# Patient Record
Sex: Male | Born: 1985 | Race: White | Hispanic: No | Marital: Married | Smoking: Never smoker
Health system: Southern US, Community
[De-identification: ages and names within clinical notes are randomized; demographics above are authoritative.]

## PROBLEM LIST (undated history)

## (undated) DIAGNOSIS — F909 Attention-deficit hyperactivity disorder, unspecified type: Secondary | ICD-10-CM

## (undated) DIAGNOSIS — R55 Syncope and collapse: Secondary | ICD-10-CM

## (undated) DIAGNOSIS — N400 Enlarged prostate without lower urinary tract symptoms: Secondary | ICD-10-CM

## (undated) HISTORY — DX: Syncope and collapse: R55

## (undated) HISTORY — DX: Attention-deficit hyperactivity disorder, unspecified type: F90.9

## (undated) HISTORY — PX: NO PAST SURGERIES: SHX2092

## (undated) HISTORY — DX: Benign prostatic hyperplasia without lower urinary tract symptoms: N40.0

---

## 2011-04-20 DIAGNOSIS — F419 Anxiety disorder, unspecified: Secondary | ICD-10-CM | POA: Insufficient documentation

## 2011-04-20 DIAGNOSIS — M791 Myalgia, unspecified site: Secondary | ICD-10-CM | POA: Insufficient documentation

## 2011-07-08 DIAGNOSIS — M545 Low back pain, unspecified: Secondary | ICD-10-CM | POA: Insufficient documentation

## 2013-03-17 ENCOUNTER — Ambulatory Visit: Payer: Self-pay | Admitting: Family Medicine

## 2016-01-21 ENCOUNTER — Emergency Department: Payer: Self-pay

## 2016-01-21 ENCOUNTER — Encounter: Payer: Self-pay | Admitting: Emergency Medicine

## 2016-01-21 ENCOUNTER — Emergency Department
Admission: EM | Admit: 2016-01-21 | Discharge: 2016-01-21 | Disposition: A | Discharge status: Home / Self Care | Payer: Self-pay | Attending: Emergency Medicine | Admitting: Emergency Medicine

## 2016-01-21 DIAGNOSIS — W228XXA Striking against or struck by other objects, initial encounter: Secondary | ICD-10-CM | POA: Insufficient documentation

## 2016-01-21 DIAGNOSIS — Y999 Unspecified external cause status: Secondary | ICD-10-CM | POA: Insufficient documentation

## 2016-01-21 DIAGNOSIS — S92535A Nondisplaced fracture of distal phalanx of left lesser toe(s), initial encounter for closed fracture: Secondary | ICD-10-CM | POA: Insufficient documentation

## 2016-01-21 DIAGNOSIS — Y929 Unspecified place or not applicable: Secondary | ICD-10-CM | POA: Insufficient documentation

## 2016-01-21 DIAGNOSIS — Y939 Activity, unspecified: Secondary | ICD-10-CM | POA: Insufficient documentation

## 2016-01-21 DIAGNOSIS — S92402A Displaced unspecified fracture of left great toe, initial encounter for closed fracture: Secondary | ICD-10-CM

## 2016-01-21 MED ORDER — NAPROXEN 500 MG PO TABS: 500.0000 mg | ORAL_TABLET | Freq: Two times a day (BID) | ORAL | Status: DC

## 2016-01-21 MED ORDER — HYDROCODONE-ACETAMINOPHEN 5-325 MG PO TABS: 1.0000 | ORAL_TABLET | ORAL | Status: DC | PRN

## 2016-01-21 NOTE — Discharge Instructions (Signed)
Ice and elevate as needed for pain and swelling. Wear wooden shoe for support. Follow-up with Dr. Alberteen Spindleline at [facility]. You will need to call and make an appointment.

## 2016-01-21 NOTE — ED Notes (Signed)
Reports boards fell on foot yesterday, having left great toe pain.  Ambulates without difficulty

## 2016-01-21 NOTE — ED Provider Notes (Signed)
[facility] Emergency Department Provider Note   ____________________________________________  Time seen: Approximately 8:40 AM  I have reviewed the triage vital signs and the nursing notes.   HISTORY  Chief Complaint Toe Injury   HPI Eric Mccormick is a 30 y.o. male is here with complaint of left great toe pain after a board fell on his left foot yesterday. He states that pain has increased especially with walking. This morning he noticed some bruising and swelling. He is unable to walk normally secondary to his pain. Pain is minimal while sitting. The pain is possibly an 8 while walking.   History reviewed. No pertinent past medical history.  There are no active problems to display for this patient.   History reviewed. No pertinent past surgical history.  Current Outpatient Rx  Name  Route  Sig  Dispense  Refill  . HYDROcodone-acetaminophen (NORCO/VICODIN) 5-325 MG tablet   Oral   Take 1 tablet by mouth every 4 (four) hours as needed for moderate pain.   20 tablet   0   . naproxen (NAPROSYN) 500 MG tablet   Oral   Take 1 tablet (500 mg total) by mouth 2 (two) times daily with a meal.   30 tablet   0     Allergies Review of patient's allergies indicates no known allergies.  No family history on file.  Social History Social History  Substance Use Topics  . Smoking status: Never Smoker   . Smokeless tobacco: None  . Alcohol Use: None    Review of Systems Constitutional: No fever/chills Cardiovascular: Denies chest pain. Respiratory: Denies shortness of breath. Gastrointestinal:   No nausea, no vomiting.  Musculoskeletal: Left great toe pain Skin: Negative for rash. Neurological: Negative for headaches, focal weakness or numbness.  10-point ROS otherwise negative.  ____________________________________________   PHYSICAL EXAM:  VITAL SIGNS: ED Triage Vitals  Enc Vitals Group     BP 01/21/16 0826 118/85 mmHg     Pulse  Rate 01/21/16 0826 98     Resp 01/21/16 0826 18     Temp 01/21/16 0826 97.9 F (36.6 C)     Temp Source 01/21/16 0826 Oral     SpO2 01/21/16 0826 100 %     Weight 01/21/16 0826 160 lb (72.576 kg)     Height 01/21/16 0826  (1.651 m)     Head Cir --      Peak Flow --      Pain Score --      Pain Loc --      Pain Edu? --      Excl. in GC? --     Constitutional: Alert and oriented. Well appearing and in no acute distress. Eyes: Conjunctivae are normal. PERRL. EOMI. Head: Atraumatic. Nose: No congestion/rhinnorhea. Neck: No stridor.   Cardiovascular: Normal rate, regular rhythm. Grossly normal heart sounds.  Good peripheral circulation. Respiratory: Normal respiratory effort.  No retractions. Lungs CTAB. Musculoskeletal: Left great toe with moderate tenderness and some ecchymosis at the base of the nail. There is some soft tissue swelling and tenderness on palpation. Motor sensory function intact. Remainder of the foot is nontender to palpation. No edema is noted. Neurologic:  Normal speech and language. No gross focal neurologic deficits are appreciated. No gait instability. Skin:  Skin is warm, dry and intact. Ecchymosis of the nail. No abrasions or bleeding noted Psychiatric: Mood and affect are normal. Speech and behavior are normal.  ____________________________________________   LABS (all labs ordered  are listed, but only abnormal results are displayed)  Labs Reviewed - No data to display  RADIOLOGY  Left foot per radiologist is suspicious for nondisplaced fracture lateral aspect of the distal phalanx left great toe. I, Tommi Rumpshonda L Marvie Calender, personally viewed and evaluated these images (plain radiographs) as part of my medical decision making, as well as reviewing the written report by the radiologist. ____________________________________________   PROCEDURES  Procedure(s) performed: None  Critical Care performed:  No  ____________________________________________   INITIAL IMPRESSION / ASSESSMENT AND PLAN / ED COURSE  Pertinent labs & imaging results that were available during my care of the patient were reviewed by me and considered in my medical decision making (see chart for details).  Patient's toe was buddy taped to the second toe and a postop shoe was placed. Patient is to follow-up with Dr. Alberteen Spindleline. Patient is given a prescription for naproxen and Norco as needed for severe pain. ____________________________________________   FINAL CLINICAL IMPRESSION(S) / ED DIAGNOSES  Final diagnoses:  Fracture of great toe, left, closed, initial encounter      NEW MEDICATIONS STARTED DURING THIS VISIT:  Discharge Medication List as of 01/21/2016  9:41 AM    START taking these medications   Details  HYDROcodone-acetaminophen (NORCO/VICODIN) 5-325 MG tablet Take 1 tablet by mouth every 4 (four) hours as needed for moderate pain., Starting 01/21/2016, Until Discontinued, Print    naproxen (NAPROSYN) 500 MG tablet Take 1 tablet (500 mg total) by mouth 2 (two) times daily with a meal., Starting 01/21/2016, Until Discontinued, Print         Note:  This document was prepared using Dragon voice recognition software and may include unintentional dictation errors.    Tommi RumpsRhonda L Jawaun Celmer, PA-C 01/21/16 1116  Arnaldo NatalPaul F Malinda, MD 01/21/16 [phone]

## 2016-01-21 NOTE — ED Notes (Signed)
See triage note  States he had a board fall onto left foot yesterday  Having pain with some bruising noted to left great toe

## 2017-10-09 IMAGING — DX DG FOOT COMPLETE 3+V*L*
3 series · 3 of 3 positions shown · non-contrast
Comparison: None

CLINICAL DATA: Dropped Ardiasnan Cenil on LEFT foot at work today,
pain across top of foot, initial encounter

EXAM:
LEFT FOOT - COMPLETE 3+ VIEW

[foot ap]
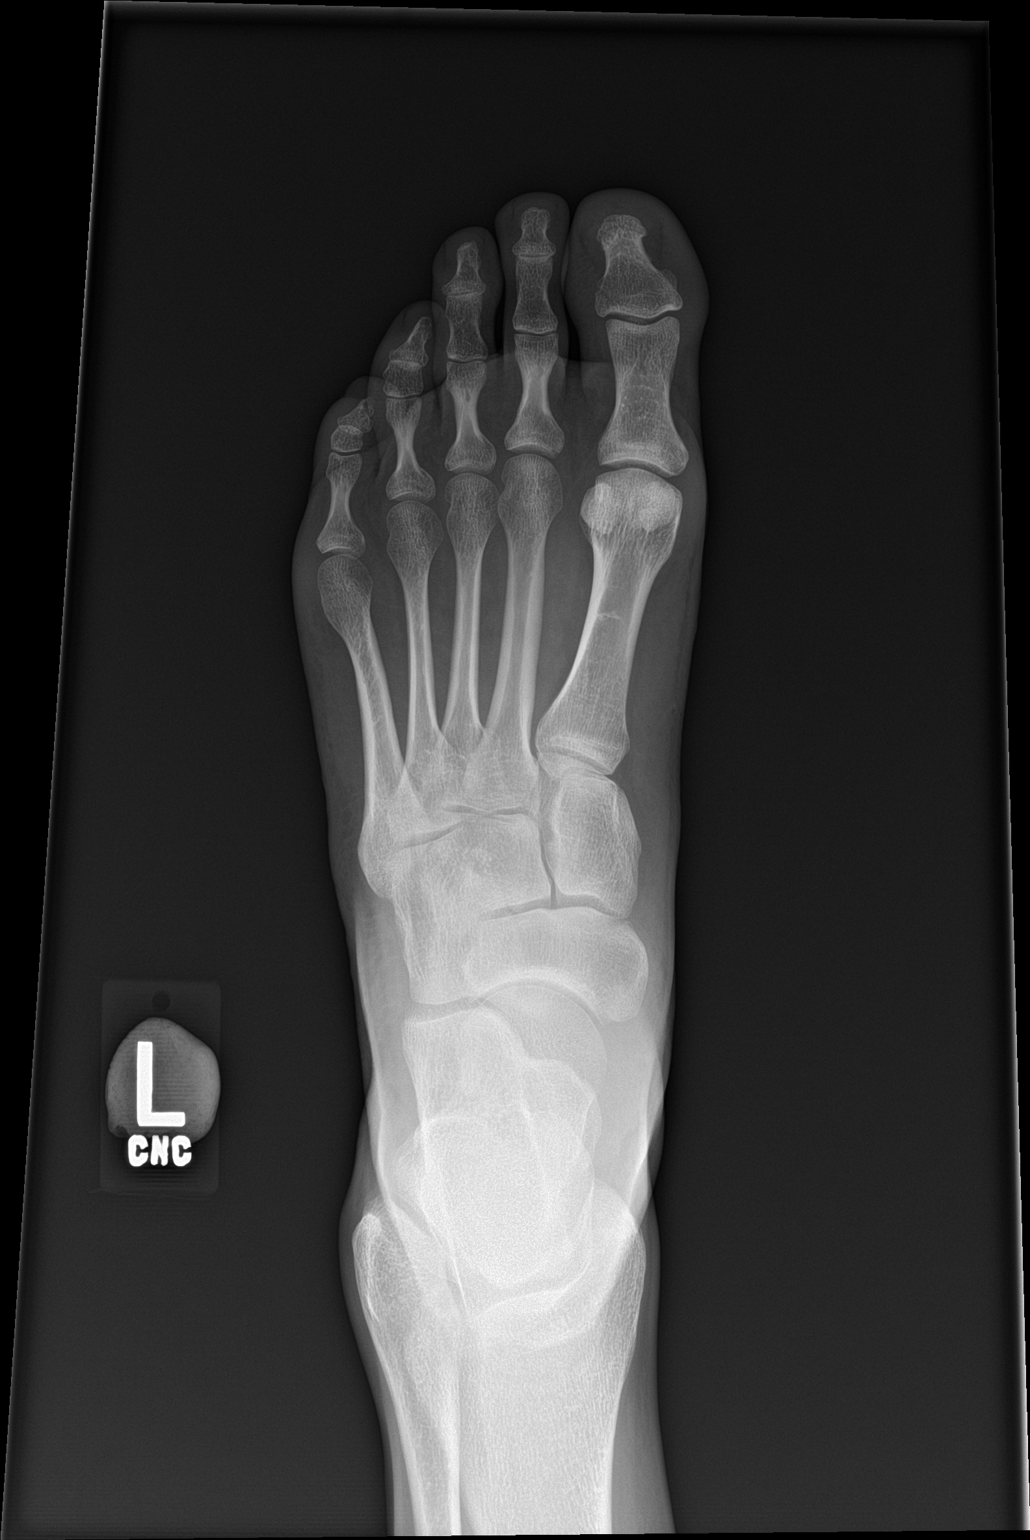

[foot obl]
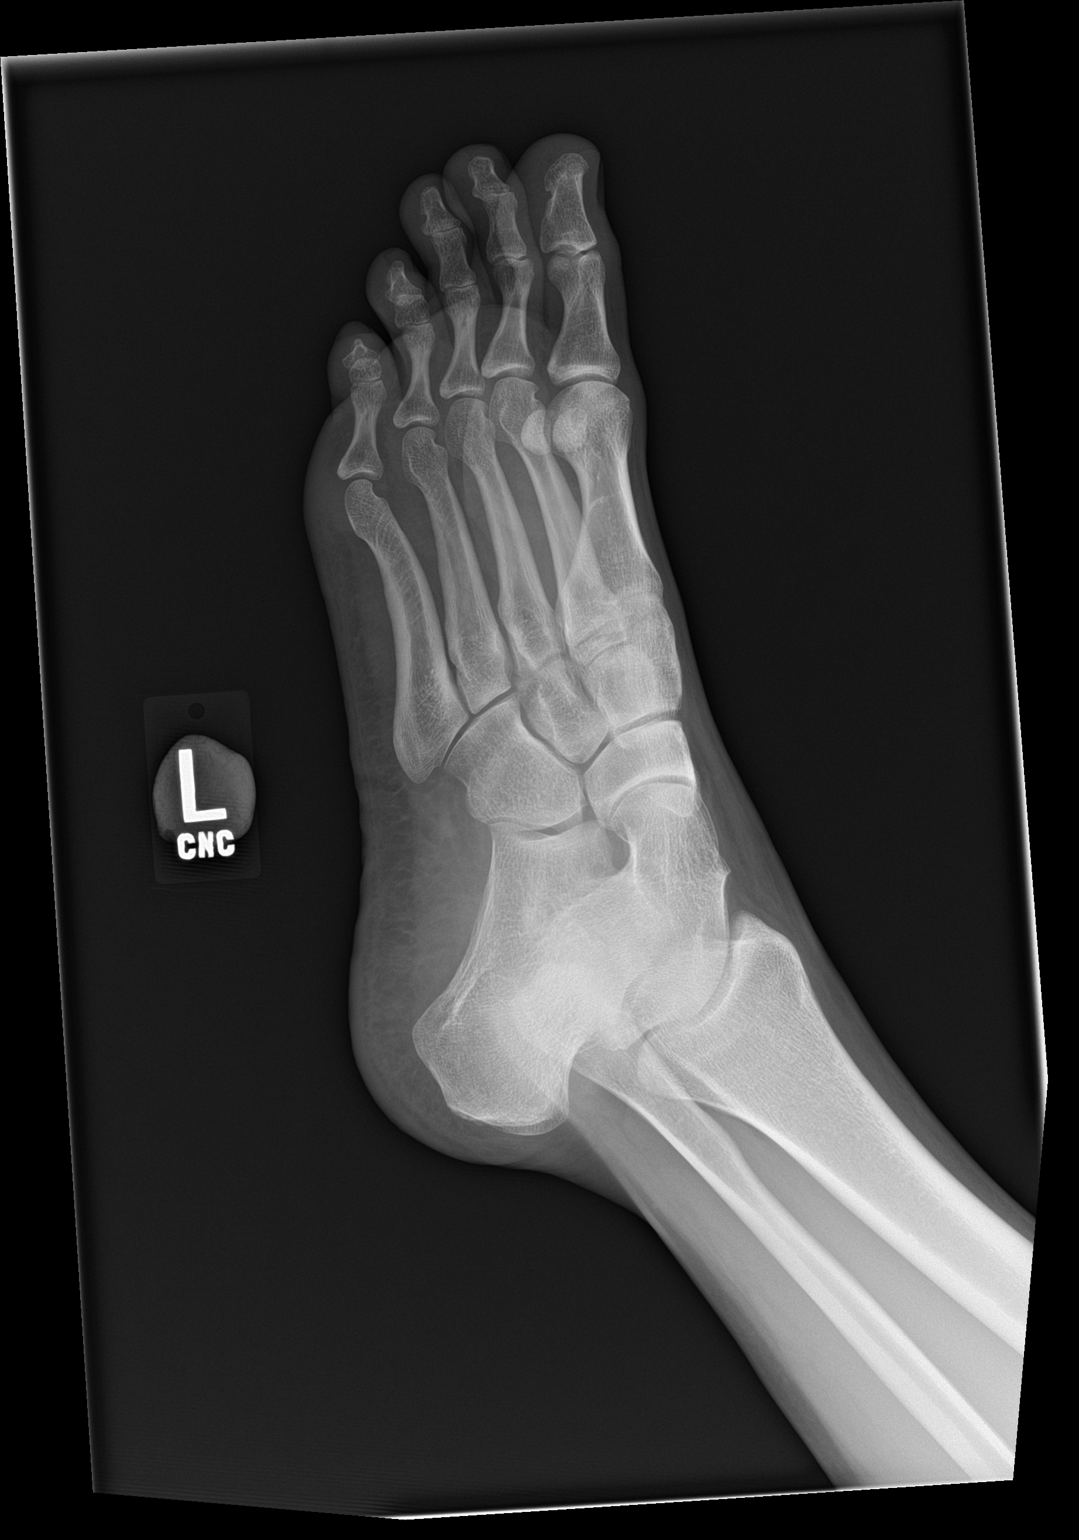

[foot lat]
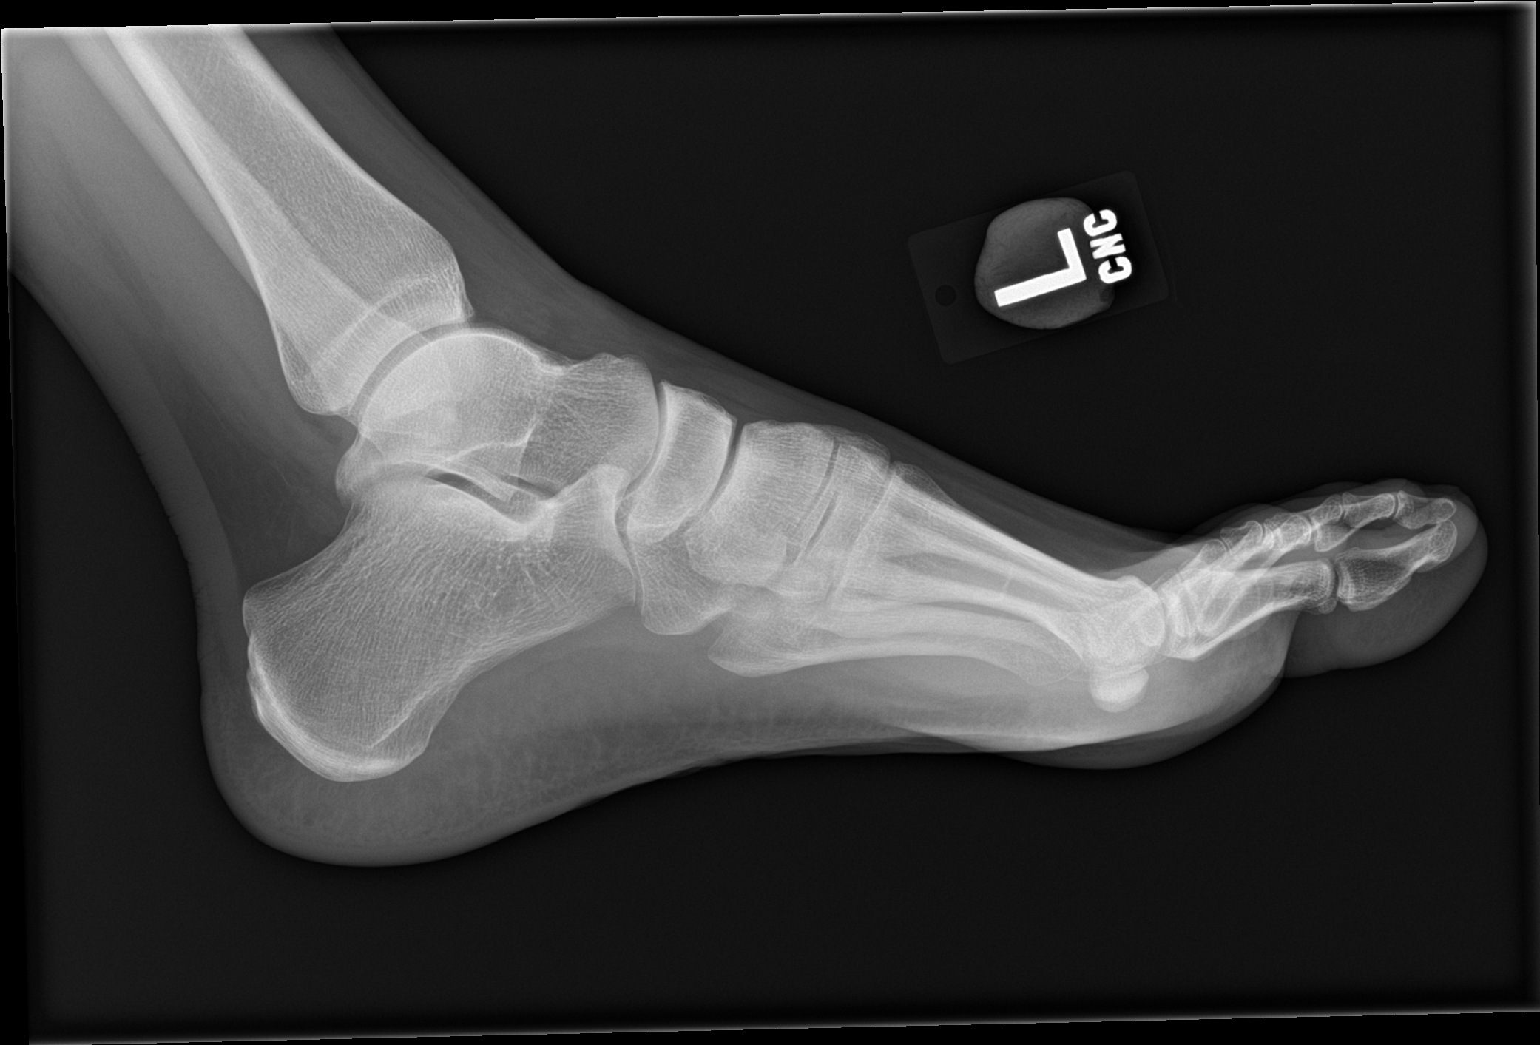

[3 of 3 positions shown; findings below may reference images not displayed]

FINDINGS: Osseous mineralization normal.

Joint spaces preserved.

Questionable linear lucency versus artifact at lateral aspect base
of distal phalanx great toe, cannot exclude nondisplaced fracture.

No additional fracture, dislocation or bone destruction.

Soft tissues unremarkable.
IMPRESSION: Questionable artifact versus nondisplaced fracture at lateral aspect
base of distal phalanx LEFT great toe; correlation for
pain/tenderness at this site recommended.

## 2018-01-09 DIAGNOSIS — D485 Neoplasm of uncertain behavior of skin: Secondary | ICD-10-CM | POA: Diagnosis not present

## 2018-01-09 DIAGNOSIS — D225 Melanocytic nevi of trunk: Secondary | ICD-10-CM | POA: Diagnosis not present

## 2018-01-09 DIAGNOSIS — Q825 Congenital non-neoplastic nevus: Secondary | ICD-10-CM | POA: Diagnosis not present

## 2018-01-09 DIAGNOSIS — L821 Other seborrheic keratosis: Secondary | ICD-10-CM | POA: Diagnosis not present

## 2018-01-09 DIAGNOSIS — D2262 Melanocytic nevi of left upper limb, including shoulder: Secondary | ICD-10-CM | POA: Diagnosis not present

## 2018-05-28 DIAGNOSIS — M545 Low back pain: Secondary | ICD-10-CM | POA: Diagnosis not present

## 2018-08-26 DIAGNOSIS — M545 Low back pain: Secondary | ICD-10-CM | POA: Diagnosis not present

## 2019-04-29 ENCOUNTER — Other Ambulatory Visit: Payer: Self-pay

## 2019-04-29 ENCOUNTER — Encounter: Payer: Self-pay | Admitting: Emergency Medicine

## 2019-04-29 ENCOUNTER — Ambulatory Visit
Admission: EM | Admit: 2019-04-29 | Discharge: 2019-04-29 | Disposition: A | Discharge status: Home / Self Care | Payer: BC Managed Care – PPO | Attending: Family Medicine | Admitting: Family Medicine

## 2019-04-29 DIAGNOSIS — M7711 Lateral epicondylitis, right elbow: Secondary | ICD-10-CM

## 2019-04-29 MED ORDER — MELOXICAM 15 MG PO TABS: 15.0000 mg | ORAL_TABLET | Freq: Every day | ORAL | 0 refills | Status: DC

## 2019-04-29 MED ORDER — HYDROCODONE-ACETAMINOPHEN 5-325 MG PO TABS: ORAL_TABLET | ORAL | 0 refills | Status: DC

## 2019-04-29 NOTE — ED Provider Notes (Signed)
[facility]    CSN: [id number] Arrival date & time: 04/29/19  1555      History   Chief Complaint Chief Complaint  Patient presents with  . Elbow Pain    right    HPI Houston SirenRobert B Monk is a 33 y.o. male.   33 yo male with a c/o right elbow pain for the past month. Denies any specific injury, falls, or other direct trauma. Denies rash, swelling, numbness/tingling.      History reviewed. No pertinent past medical history.  There are no active problems to display for this patient.   Past Surgical History:  Procedure Laterality Date  . NO PAST SURGERIES         Home Medications    Prior to Admission medications   Medication Sig Start Date End Date Taking? Authorizing Provider  HYDROcodone-acetaminophen (NORCO/VICODIN) 5-325 MG tablet 1-2 tabs po qd prn 04/29/19   Payton Mccallumonty, Tien Spooner, MD  meloxicam (MOBIC) 15 MG tablet Take 1 tablet (15 mg total) by mouth daily. 04/29/19   Payton Mccallumonty, Delissa Silba, MD  naproxen (NAPROSYN) 500 MG tablet Take 1 tablet (500 mg total) by mouth 2 (two) times daily with a meal. 01/21/16   Tommi RumpsSummers, Rhonda L, PA-C    Family History Family History  Problem Relation Age of Onset  . Hypertension Mother   . Hypertension Father     Social History Social History   Tobacco Use  . Smoking status: Never Smoker  . Smokeless tobacco: Never Used  Substance Use Topics  . Alcohol use: Not Currently  . Drug use: Not Currently     Allergies   Patient has no known allergies.   Review of Systems Review of Systems   Physical Exam Triage Vital Signs ED Triage Vitals  Enc Vitals Group     BP 04/29/19 1629 125/85     Pulse Rate 04/29/19 1629 (!) 115     Resp 04/29/19 1629 18     Temp 04/29/19 1629 99 F (37.2 C)     Temp Source 04/29/19 1629 Oral     SpO2 04/29/19 1629 98 %     Weight 04/29/19 1626 162 lb (73.5 kg)     Height 04/29/19 1626 5\' 6"  (1.676 m)     Head Circumference --      Peak Flow --      Pain Score 04/29/19 1626 6   Pain Loc --      Pain Edu? --      Excl. in GC? --    No data found.  Updated Vital Signs BP 125/85 (BP Location: Left Arm)   Pulse (!) 115   Temp 99 F (37.2 C) (Oral)   Resp 18   Ht 5\' 6"  (1.676 m)   Wt 73.5 kg   SpO2 98%   BMI 26.15 kg/m   Visual Acuity Right Eye Distance:   Left Eye Distance:   Bilateral Distance:    Right Eye Near:   Left Eye Near:    Bilateral Near:     Physical Exam Vitals signs and nursing note reviewed.  Constitutional:      General: He is not in acute distress.    Appearance: He is not toxic-appearing or diaphoretic.  Musculoskeletal:     Right elbow: He exhibits normal range of motion, no swelling, no effusion, no deformity and no laceration. Tenderness found. Lateral epicondyle tenderness noted.     Comments: Right arm neurovascularly intact  Neurological:     Mental Status: He is  alert.      UC Treatments / Results  Labs (all labs ordered are listed, but only abnormal results are displayed) Labs Reviewed - No data to display  EKG   Radiology No results found.  Procedures Procedures (including critical care time)  Medications Ordered in UC Medications - No data to display  Initial Impression / Assessment and Plan / UC Course  I have reviewed the triage vital signs and the nursing notes.  Pertinent labs & imaging results that were available during my care of the patient were reviewed by me and considered in my medical decision making (see chart for details).      Final Clinical Impressions(s) / UC Diagnoses   Final diagnoses:  Lateral epicondylitis of right elbow     Discharge Instructions     Rest, ice    ED Prescriptions    Medication Sig Dispense Auth. Provider   meloxicam (MOBIC) 15 MG tablet Take 1 tablet (15 mg total) by mouth daily. 30 tablet Norval Gable, MD   HYDROcodone-acetaminophen (NORCO/VICODIN) 5-325 MG tablet 1-2 tabs po qd prn 8 tablet Norval Gable, MD      1. diagnosis reviewed with  patient 2. rx as per orders above; reviewed possible side effects, interactions, risks and benefits  3. Recommend supportive treatment as above 4. Follow-up prn if symptoms worsen or don't improve   Controlled Substance Prescriptions [place] Controlled Substance Registry consulted? Yes, I have consulted the [place] Controlled Substances Registry for this patient, and feel the risk/benefit ratio today is favorable for proceeding with this prescription for a controlled substance.   Norval Gable, MD 04/29/19 [phone]

## 2019-04-29 NOTE — Discharge Instructions (Signed)
Rest, ice

## 2019-04-29 NOTE — ED Triage Notes (Signed)
Pt c/o right elbow pain. Started about a month ago. He rests it on the weekends but then when he goes back to work it gets worse again.

## 2019-07-08 DIAGNOSIS — M542 Cervicalgia: Secondary | ICD-10-CM | POA: Insufficient documentation

## 2019-07-08 DIAGNOSIS — S29012A Strain of muscle and tendon of back wall of thorax, initial encounter: Secondary | ICD-10-CM | POA: Diagnosis not present

## 2019-07-16 DIAGNOSIS — M5412 Radiculopathy, cervical region: Secondary | ICD-10-CM | POA: Diagnosis not present

## 2019-07-16 DIAGNOSIS — M542 Cervicalgia: Secondary | ICD-10-CM | POA: Diagnosis not present

## 2019-07-16 DIAGNOSIS — S29012A Strain of muscle and tendon of back wall of thorax, initial encounter: Secondary | ICD-10-CM | POA: Diagnosis not present

## 2019-07-29 NOTE — Progress Notes (Addendum)
Patient: Eric Mccormick, Male    DOB: 26-Jul-1986, 33 y.o.   MRN: [id number] Visit Date: 07/30/2019  Today's Provider: Jairo Ben, FNP   Chief Complaint  Patient presents with  . Establish Care  . New Patient (Initial Visit)   Subjective:     New Patient/Establish Care: Eric Mccormick is a 33 y.o. male who presents today to establish care as new patient. He feels well. He reports exercising none. He reports he is sleeping well.  ----------------------------------------------------------------- Patient with history of ADHD and is not medication at this time and feels that he need to go back on the medication he has not been taking it for a few years. He has taken Adderall 20 mg XR. Patient feels that his focused is not well and he gets sight track all the time.  He is had an injury and is on Hydrocodone -acetaminophen 5-325 mg followed by [facility] Dr. Margit Banda and is due to follow-up in 2 weeks.He finished the Prednisone.   Saw Dr. Craig Staggers in [place] as PCP last - he will get records sent.    He has a history of syncope - last 2 years ago. He denies any episodes in over two years- saw specialist- holter monitor was performed by cardiology.  He reports that all was " all ok". Denies ever seeing neurology in past. He denies any new or changing concerns. [facility] test [facility]  He denies any drug use. Alcohol occasionally.   He denies headaches or any neurological symptoms.  Reviewed: CT brain without contrast2/27/2015 [facility] Result Narrative  ** NONCONTRAST BRAIN CT **  INDICATION: 780.2 Syncope and collapse, cardiac provided.  COMPARISON: None  TECHNIQUE: Standard noncontrast brain CT.  FINDINGS: No hemorrhage, extra-axial fluid collection, cerebral edema, acute cortical infarction, mass, mass effect, midline shift. Moderate microangiopathic disease. Ventricles are normal for patient age. Mild sulcal prominence.  Minimal right maxillary sinus disease. Otherwise normal paranasal sinuses, orbits and cranium.  IMPRESSION: No acute intracranial abnormalities.  Electronically Reviewed FA:[id number] Cho, MD Electronically Reviewed on:10/25/2013 8:51 PM  I have reviewed the images and concur with the above findings.  Electronically Signed HQ:IONGEX Judeth Horn, MD Electronically Signed on:10/27/2013 7:46 PM     Patient  denies any fever, body aches,chills, rash, chest pain, shortness of breath, nausea, vomiting, or diarrhea.   Review of Systems  Constitutional: Negative for activity change, appetite change, chills, diaphoresis, fatigue, fever and unexpected weight change.  HENT: Positive for congestion ("chronic"), nosebleeds, postnasal drip, sinus pressure and tinnitus. Negative for dental problem, drooling, ear discharge, ear pain, facial swelling, hearing loss, mouth sores, rhinorrhea, sinus pain, sneezing, sore throat, trouble swallowing and voice change.        Chronic no change. Takes generic generic zyrtec and generic allergy nose spray.   Eyes: Negative for photophobia, pain, discharge, redness, itching and visual disturbance.  Respiratory: Negative for apnea, cough, choking, chest tightness, shortness of breath, wheezing and stridor.   Cardiovascular: Negative for chest pain, palpitations and leg swelling.  Gastrointestinal: Negative.   Endocrine: Positive for polydipsia and polyuria.  Genitourinary: Positive for urgency. Negative for decreased urine volume, difficulty urinating, discharge, dysuria, enuresis, flank pain, frequency, genital sores, hematuria, penile pain, penile swelling, scrotal swelling and testicular pain.       Frequent urination. Was told by last pcp - had enlarged prostate and took antibiotics. Has improved, he has urgency no frequency. Denies any burning.   No genital  sore, in past he has had. Denies any now.   Musculoskeletal: Positive for arthralgias, back pain  ("chronic") and myalgias.  Skin: Negative.   Allergic/Immunologic: Positive for food allergies ("mussels only").  Neurological: Positive for syncope ("with fainting spells" over two years ago none since denies any pre syncope either. ). Negative for dizziness, tremors, seizures, facial asymmetry, speech difficulty, weakness, light-headedness, numbness and headaches.  Hematological: Negative for adenopathy. Does not bruise/bleed easily.  Psychiatric/Behavioral: Positive for decreased concentration (he reports he has been evaluated and treated since a child. ). Negative for agitation, behavioral problems, confusion, dysphoric mood, hallucinations, self-injury, sleep disturbance and suicidal ideas. The patient is not nervous/anxious and is not hyperactive.        He has been on Adderal since he was 33 years old. No issues in past.    All other systems reviewed and are negative.   Social History      He  reports that he has never smoked. He has never used smokeless tobacco. He reports previous alcohol use. He reports previous drug use.       Social History   Socioeconomic History  . Marital status: Married    Spouse name: Not on file  . Number of children: Not on file  . Years of education: Not on file  . Highest education level: Not on file  Occupational History  . Not on file  Social Needs  . Financial resource strain: Not on file  . Food insecurity    Worry: Not on file    Inability: Not on file  . Transportation needs    Medical: Not on file    Non-medical: Not on file  Tobacco Use  . Smoking status: Never Smoker  . Smokeless tobacco: Never Used  Substance and Sexual Activity  . Alcohol use: Not Currently  . Drug use: Not Currently  . Sexual activity: Not on file  Lifestyle  . Physical activity    Days per week: Not on file    Minutes per session: Not on file  . Stress: Not on file  Relationships  . Social Musician on phone: Not on file    Gets together: Not  on file    Attends religious service: Not on file    Active member of club or organization: Not on file    Attends meetings of clubs or organizations: Not on file    Relationship status: Not on file  Other Topics Concern  . Not on file  Social History Narrative  . Not on file    Past Medical History:  Diagnosis Date  . ADHD   . Enlarged prostate   . Vasovagal syncope      Patient Active Problem List   Diagnosis Date Noted  . Cervical spine pain 07/08/2019  . Low back pain 07/08/2011  . ADD (attention deficit disorder) 04/20/2011  . Anxiety 04/20/2011  . Muscle pain 04/20/2011    Past Surgical History:  Procedure Laterality Date  . NO PAST SURGERIES      Family History        Family Status  Relation Name Status  . Mother  Alive  . Father  Alive  . Daughter  (Not Specified)  . Son  (Not Specified)  . Mat Uncle  (Not Specified)  . Emelda Brothers  (Not Specified)  . Oneal Grout  Other       substance abuse  . MGM  (Not Specified)  . MGF  (Not Specified)  .  PGM  (Not Specified)  . PGF  Other       fatal MI @ [age]        His family history includes ADD / ADHD in his daughter and son; Arthritis in his paternal grandmother; Cancer in his paternal aunt; Hyperlipidemia in his father and maternal grandmother; Hypertension in his father, maternal grandfather, maternal grandmother, maternal uncle, and mother; Macular degeneration in his paternal grandmother.      Allergies  Allergen Reactions  . Other Nausea And Vomiting    Mussels     Current Outpatient Medications:  .  amphetamine-dextroamphetamine (ADDERALL) 20 MG tablet, Take by mouth., Disp: , Rfl:  .  clonazePAM (KLONOPIN) 0.5 MG tablet, Take by mouth., Disp: , Rfl:  .  HYDROcodone-acetaminophen (NORCO/VICODIN) 5-325 MG tablet, 1-2 tabs po qd prn, Disp: 8 tablet, Rfl: 0 .  ibuprofen (ADVIL) 600 MG tablet, Take 600 mg by mouth as needed. , Disp: , Rfl:  .  meloxicam (MOBIC) 15 MG tablet, Take 1 tablet (15 mg total) by  mouth daily. (Patient not taking: Reported on 07/30/2019), Disp: 30 tablet, Rfl: 0 .  naproxen (NAPROSYN) 500 MG tablet, Take 1 tablet (500 mg total) by mouth 2 (two) times daily with a meal. (Patient not taking: Reported on 07/30/2019), Disp: 30 tablet, Rfl: 0 .  predniSONE (DELTASONE) 10 MG tablet, prednisone 10 mg tablet, Disp: , Rfl:  .  traMADol (ULTRAM) 50 MG tablet, Take 50 mg by mouth every 4 (four) hours., Disp: , Rfl:    Patient Care Team: Berniece PapFlinchum, Michelle S, FNP as PCP - General (Family Medicine)    Objective:      Vitals:   07/30/19 0913  BP: 132/88  Pulse: 72  Resp: 16  Temp: 98.1 F (36.7 C)  TempSrc: Temporal  SpO2: 97%  Weight: 179 lb (81.2 kg)  Height: 5\' 5"  (1.651 m)  Body mass index is 29.79 kg/m.  Physical Exam Vitals signs reviewed.  Constitutional:      General: He is not in acute distress.    Appearance: Normal appearance. He is not ill-appearing, toxic-appearing or diaphoretic.  HENT:     Head: Normocephalic and atraumatic.     Right Ear: Tympanic membrane, ear canal and external ear normal. There is no impacted cerumen.     Left Ear: Tympanic membrane, ear canal and external ear normal. There is no impacted cerumen.     Nose: Nose normal.     Mouth/Throat:     Mouth: Mucous membranes are moist.     Pharynx: No oropharyngeal exudate or posterior oropharyngeal erythema.  Eyes:     General: No scleral icterus.       Right eye: No discharge.        Left eye: No discharge.     Extraocular Movements: Extraocular movements intact.     Conjunctiva/sclera: Conjunctivae normal.     Pupils: Pupils are equal, round, and reactive to light.  Neck:     Musculoskeletal: Normal range of motion and neck supple.  Cardiovascular:     Rate and Rhythm: Normal rate and regular rhythm.  Pulmonary:     Effort: Pulmonary effort is normal. No respiratory distress.     Breath sounds: Normal breath sounds. No stridor. No wheezing, rhonchi or rales.  Chest:     Chest  wall: No tenderness.  Abdominal:     General: There is no distension.     Palpations: Abdomen is soft. There is no mass.     Tenderness:  There is no abdominal tenderness. There is no right CVA tenderness, left CVA tenderness, guarding or rebound.     Hernia: No hernia is present.  Genitourinary:    Comments: Declined by patient. / deferred.  Musculoskeletal: Normal range of motion.  Skin:    General: Skin is warm.     Capillary Refill: Capillary refill takes less than 2 seconds.  Neurological:     Mental Status: He is alert and oriented to person, place, and time.  Psychiatric:        Mood and Affect: Mood normal.        Behavior: Behavior normal.        Thought Content: Thought content normal.        Judgment: Judgment normal.      Depression Screen PHQ 2/9 Scores 07/30/2019  PHQ - 2 Score 0       Assessment & Plan:     Routine Health Maintenance and Physical Exam  Exercise Activities and Dietary recommendations Goals   None     Immunization History  Administered Date(s) Administered  . Influenza-Unspecified 06/09/2011    Health Maintenance  Topic Date Due  . HIV Screening  02/03/2001  . TETANUS/TDAP  02/03/2005  . INFLUENZA VACCINE  03/30/2019     Discussed health benefits of physical activity, and encouraged him to engage in regular exercise appropriate for his age and condition.     1. Need for influenza vaccination  - Flu Vaccine QUAD 36+ mos PF IM (Fluarix & Fluzone Quad PF)  2. History of acute prostatitis He still feels he has urinary frequency and pressure at times.   - Ambulatory referral to Urology  3. Long-term current use of stimulant  - Ambulatory referral to Cardiology  4. History of syncope Has seen cardiology over 5 years ago. Would like to establish with cardiology in the area.  - Ambulatory referral to Cardiology  5. Routine health maintenance  Drug Screen 12+Alcohol+CRT, Ur - CBC with Differential/Platelet 005009 - CMP  322000 - PSA - TSH - Lipid panel - Urinalysis, Routine w reflex microscopic  Neck pain, he will keep follow up's with his specialist at [facility] - no narcotic refills given when requested. Advised over the counter pain medications as on package or fill with specialty.   6. Attention deficit hyperactivity disorder (ADHD), unspecified ADHD type He has been off medication for a few years last prescribed 2015. Would like him evaluated by  Cardiology given his history of syncope and specialist for ADHD.  Discussed at  ADHD specialist recommended. Can schedule appointment online.   [facility] Location [facility] [phone] [address], [place], [place] [address]  Phone: [phone] Fax: [phone]  Hours of Operation Monday to Friday 8:00 AM - 5:00 PM Saturday and Sunday: Closed   --------------------------------------------------------------------  The entirety of the information documented in the History of Present Illness, Review of Systems and Physical Exam were personally obtained by me. Portions of this information were initially documented by the  Certified Medical Assistant whose name is documented in [facility] and reviewed by me for thoroughness and accuracy.  I have personally performed the exam and reviewed the chart and it is accurate to the best of my knowledge.  Haematologist has been used and any errors in dictation or transcription are unintentional.  Kelby Aline. [place], [facility]

## 2019-07-30 ENCOUNTER — Other Ambulatory Visit: Payer: Self-pay

## 2019-07-30 ENCOUNTER — Encounter: Payer: Self-pay | Admitting: Adult Health

## 2019-07-30 ENCOUNTER — Ambulatory Visit (INDEPENDENT_AMBULATORY_CARE_PROVIDER_SITE_OTHER): Payer: BC Managed Care – PPO | Admitting: Adult Health

## 2019-07-30 VITALS — BP 132/88 | HR 72 | Temp 98.1°F | Resp 16 | Ht 65.0 in | Wt 179.0 lb

## 2019-07-30 DIAGNOSIS — Z87898 Personal history of other specified conditions: Secondary | ICD-10-CM

## 2019-07-30 DIAGNOSIS — Z79899 Other long term (current) drug therapy: Secondary | ICD-10-CM

## 2019-07-30 DIAGNOSIS — F909 Attention-deficit hyperactivity disorder, unspecified type: Secondary | ICD-10-CM | POA: Diagnosis not present

## 2019-07-30 DIAGNOSIS — Z23 Encounter for immunization: Secondary | ICD-10-CM | POA: Diagnosis not present

## 2019-07-30 DIAGNOSIS — Z87438 Personal history of other diseases of male genital organs: Secondary | ICD-10-CM | POA: Diagnosis not present

## 2019-07-30 DIAGNOSIS — Z0000 Encounter for general adult medical examination without abnormal findings: Secondary | ICD-10-CM

## 2019-07-30 NOTE — Patient Instructions (Signed)
ADHD specialist recommended. Can schedule appointment online.   [facility] [phone] [address], [address] [place], [place] [address]  Phone: [phone] Fax: [phone]  Hours of Operation Monday to Friday 8:00 AM - 5:00 PM Saturday and Sunday: Closed   Syncope none in two years If reoccurs will refer to cardiology and neurology for evaluation. Let me know if you would like  Health Maintenance, Male Adopting a healthy lifestyle and getting preventive care are important in promoting health and wellness. Ask your health care provider about:  The right schedule for you to have regular tests and exams.  Things you can do on your own to prevent diseases and keep yourself healthy. What should I know about diet, weight, and exercise? Eat a healthy diet   Eat a diet that includes plenty of vegetables, fruits, low-fat dairy products, and lean protein.  Do not eat a lot of foods that are high in solid fats, added sugars, or sodium. Maintain a healthy weight Body mass index (BMI) is a measurement that can be used to identify possible weight problems. It estimates body fat based on height and weight. Your health care provider can help determine your BMI and help you achieve or maintain a healthy weight. Get regular exercise Get regular exercise. This is one of the most important things you can do for your health. Most adults should:  Exercise for at least 150 minutes each week. The exercise should increase your heart rate and make you sweat (moderate-intensity exercise).  Do strengthening exercises at least twice a week. This is in addition to the moderate-intensity exercise.  Spend less time sitting. Even light physical activity can be beneficial. Watch cholesterol and blood lipids Have your blood tested for lipids and cholesterol at 33 years of age, then have this test every 5 years. You may need to have your cholesterol levels checked more often if:   Your lipid or cholesterol levels are high.  You are older than 33 years of age.  You are at high risk for heart disease. What should I know about cancer screening? Many types of cancers can be detected early and may often be prevented. Depending on your health history and family history, you may need to have cancer screening at various ages. This may include screening for:  Colorectal cancer.  Prostate cancer.  Skin cancer.  Lung cancer. What should I know about heart disease, diabetes, and high blood pressure? Blood pressure and heart disease  High blood pressure causes heart disease and increases the risk of stroke. This is more likely to develop in people who have high blood pressure readings, are of African descent, or are overweight.  Talk with your health care provider about your target blood pressure readings.  Have your blood pressure checked: ? Every 3-5 years if you are 25-53 years of age. ? Every year if you are 47 years old or older.  If you are between the ages of 78 and 68 and are a current or former smoker, ask your health care provider if you should have a one-time screening for abdominal aortic aneurysm (AAA). Diabetes Have regular diabetes screenings. This checks your fasting blood sugar level. Have the screening done:  Once every three years after age 15 if you are at a normal weight and have a low risk for diabetes.  More often and at a younger age if you are overweight or have a high risk for diabetes. What should I know about preventing infection? Hepatitis  B If you have a higher risk for hepatitis B, you should be screened for this virus. Talk with your health care provider to find out if you are at risk for hepatitis B infection. Hepatitis C Blood testing is recommended for:  Everyone born from [birth year] through 1965.  Anyone with known risk factors for hepatitis C. Sexually transmitted infections (STIs)  You should be screened each year for STIs,  including gonorrhea and chlamydia, if: ? You are sexually active and are younger than 33 years of age. ? You are older than 33 years of age and your health care provider tells you that you are at risk for this type of infection. ? Your sexual activity has changed since you were last screened, and you are at increased risk for chlamydia or gonorrhea. Ask your health care provider if you are at risk.  Ask your health care provider about whether you are at high risk for HIV. Your health care provider may recommend a prescription medicine to help prevent HIV infection. If you choose to take medicine to prevent HIV, you should first get tested for HIV. You should then be tested every 3 months for as long as you are taking the medicine. Follow these instructions at home: Lifestyle  Do not use any products that contain nicotine or tobacco, such as cigarettes, e-cigarettes, and chewing tobacco. If you need help quitting, ask your health care provider.  Do not use street drugs.  Do not share needles.  Ask your health care provider for help if you need support or information about quitting drugs. Alcohol use  Do not drink alcohol if your health care provider tells you not to drink.  If you drink alcohol: ? Limit how much you have to 0-2 drinks a day. ? Be aware of how much alcohol is in your drink. In the [place] one drink equals one 12 oz bottle of beer (355 mL), one 5 oz glass of wine (148 mL), or one 1 oz glass of hard liquor (44 mL). General instructions  Schedule regular health, dental, and eye exams.  Stay current with your vaccines.  Tell your health care provider if: ? You often feel depressed. ? You have ever been abused or do not feel safe at home. Summary  Adopting a healthy lifestyle and getting preventive care are important in promoting health and wellness.  Follow your health care provider's instructions about healthy diet, exercising, and getting tested or screened for  diseases.  Follow your health care provider's instructions on monitoring your cholesterol and blood pressure. This information is not intended to replace advice given to you by your health care provider. Make sure you discuss any questions you have with your health care provider. Document Released: 02/11/2008 Document Revised: 08/08/2018 Document Reviewed: 08/08/2018 Elsevier Patient Education  2020 [employer]. to proceed  Attention Deficit Hyperactivity Disorder, Adult Attention deficit hyperactivity disorder (ADHD) is a mental health disorder that starts during childhood. For many people with ADHD, the disorder continues into adult years. There are many things that you and your health care provider or therapist (mental health professional) can do to manage your symptoms. What are the causes? The exact cause of ADHD is not known. What increases the risk? You are more likely to develop this condition if:  You have a family history of ADHD.  You are male.  You were born to a mother who smoked or drank alcohol during pregnancy.  You were exposed to lead poisoning or other toxins in  the womb or in early life.  You were born before 37 weeks of pregnancy (prematurely) or you had a low birth weight.  You have experienced a brain injury. What are the signs or symptoms? Symptoms of this condition depend on the type of ADHD. The two main types are inattentive and hyperactive-impulsive. Some people may have symptoms of both types. Symptoms of the inattentive type include:  Difficulty watching, listening, or thinking with focused effort (paying attention).  Making careless mistakes.  Not listening.  Not following instructions.  Being disorganized.  Avoiding tasks that require time and attention.  Losing things.  Forgetting things.  Being easily distracted. Symptoms of the hyperactive-impulsive type include:  Restlessness.  Talking too much.  Interrupting.  Difficulty  with: ? Sitting still. ? Staying quiet. ? Feeling motivated. ? Relaxing. ? Waiting in line or waiting for a turn. How is this diagnosed? This condition is diagnosed based on your current symptoms and your history of symptoms. The diagnosis can be made by a provider such as a primary care provider, psychiatrist, psychologist, or clinical social worker. The provider may use a symptom checklist or a standardized behavior rating scale to evaluate your symptoms. He or she may want to talk with family members who have known you for a long time and have observed your behaviors. There are no lab tests or brain imaging tests that can diagnose ADHD. How is this treated? This condition can be treated with medicines and behavior therapy. Medicines may be the best option to reduce impulsive behaviors and improve attention. Your health care provider may recommend:  Stimulant medicines. These are the most common medicines used for adult ADHD. They affect certain chemicals in the brain (neurotransmitters). These medicines may be long-acting or short-acting. This will determine how often you need to take the medicine.  A non-stimulant medicine for adult ADHD (atomoxetine). This medicine increases a neurotransmitter called norepinephrine. It may take weeks to months to see effects from this medicine. Psychotherapy and behavioral management are also important for treating ADHD. Psychotherapy is often used along with medicine. Your health care provider may suggest:  Cognitive behavioral therapy (CBT). This type of therapy teaches you to replace negative thoughts and actions with positive thoughts and actions. When used as part of ADHD treatment, this therapy may also include: ? Coping strategies for organization, time management, impulse control, and stress reduction. ? Mindfulness and meditation training.  Behavioral management. This may include strategies for organization and time management. You may work with an  ADHD coach who is specially trained to help people with ADHD to manage and organize activities and to function more effectively. Follow these instructions at home: Medicines   Take over-the-counter and prescription medicines only as told by your health care provider.  Talk with your health care provider about the possible side effects of your medicine to watch for. General instructions   Learn as much as you can about adult ADHD, and work closely with your health care providers to find the treatments that work best for you.  Do not use drugs or abuse alcohol. Limit alcohol intake to no more than 1 drink a day for nonpregnant women and 2 drinks a day for men. One drink equals 12 oz of beer, 5 oz of wine, or 1 oz of hard liquor.  Follow the same schedule each day. Make sure your schedule includes enough time for you to get plenty of sleep.  Use reminder devices like notes, calendars, and phone apps to stay  on-time and organized.  Eat a healthy diet. Do not skip meals.  Exercise regularly. Exercise can help to reduce stress and anxiety.  Keep all follow-up visits as told by your health care provider and therapist. This is important. Where to find more information  A health care provider may be able to recommend resources that are available online or over the phone. You could start with: ? Attention Deficit Disorder Association (ADDA): http://davis-dillon.net/www.add.org ? [employer](NIMH): http://www.maynard.net/www.nimh.nih.gov Contact a health care provider if:  Your symptoms are changing, getting worse, or not improving.  You have side effects from your medicine, such as: ? Repeated muscle twitches, coughing, or speech outbursts. ? Sleep problems. ? Loss of appetite. ? Depression. ? New or worsening behavior problems. ? Dizziness. ? Unusually fast heartbeat. ? Stomach pains. ? Headaches.  You are struggling with anxiety, depression, or substance abuse. Get help right away if:  You have a  severe reaction to a medicine.  You have thoughts of hurting yourself or others. If you ever feel like you may hurt yourself or others, or have thoughts about taking your own life, get help right away. You can go to the nearest emergency department or call:  Your local emergency services (911 in the [place]  A suicide crisis helpline, such as the National Suicide Prevention Lifeline at 35260782801-(207)526-9810. This is open 24 hours a day. Summary  ADHD is a mental health disorder that starts during childhood and often continues into adult years.  The exact cause of ADHD is not known.  There is no cure for ADHD, but treatment with medicine, therapy, or behavioral training can help you manage your condition. This information is not intended to replace advice given to you by your health care provider. Make sure you discuss any questions you have with your health care provider. Document Released: 04/06/2017 Document Revised: 07/28/2017 Document Reviewed: 04/06/2017 Elsevier Patient Education  2020 Elsevier Inc[employer] Amphetamine; Dextroamphetamine extended-release capsules What is this medicine? AMPHETAMINE; DEXTROAMPHETAMINE (am FET a meen; dex troe am FET a meen) is used to treat attention-deficit hyperactivity disorder (ADHD). Federal law prohibits giving this medicine to any person other than the person for whom it was prescribed. Do not share this medicine with anyone else. This medicine may be used for other purposes; ask your health care provider or pharmacist if you have questions. COMMON BRAND NAME(S): Adderall XR, Mydayis What should I tell my health care provider before I take this medicine? They need to know if you have any of these conditions:  anxiety or panic attacks  circulation problems in fingers and toes  glaucoma  hardening or blockages of the arteries or heart blood vessels  heart disease or a heart defect  high blood pressure  history of a drug or alcohol abuse problem   history of stroke  kidney disease  liver disease  mental illness  seizures  suicidal thoughts, plans, or attempt; a previous suicide attempt by you or a family member  thyroid disease  Tourette's syndrome  an unusual or allergic reaction to dextroamphetamine, other amphetamines, other medicines, foods, dyes, or preservatives  pregnant or trying to get pregnant  breast-feeding How should I use this medicine? Take this medicine by mouth with a glass of water. Follow the directions on the prescription label. This medicine is taken just one time per day, usually in the morning after waking up. Take with or without food. Do not chew or crush this medicine. You may open the capsules  and sprinkle the medicine on a spoonful of applesauce. If sprinkled on applesauce, take the dose immediately and do not crush or chew. Do not take your medicine more often than directed. A special MedGuide will be given to you by the pharmacist with each prescription and refill. Be sure to read this information carefully each time. Talk to your pediatrician regarding the use of this medicine in children. While this drug may be prescribed for children as young as 6 years for selected conditions, precautions do apply. Some extended-release capsules are recommended for use only in children 51 years of age and older. Overdosage: If you think you have taken too much of this medicine contact a poison control center or emergency room at once. NOTE: This medicine is only for you. Do not share this medicine with others. What if I miss a dose? If you miss a dose, take it as soon as you can in the morning, but do not take it later in the day because it can cause trouble sleeping. If it is almost time for your next dose, take only that dose. Do not take double or extra doses. What may interact with this medicine? Do not take this medicine with any of the following medications:  MAOIs like Carbex, Eldepryl, Marplan, Nardil,  and Parnate  other stimulant medicines for attention disorders This medicine may also interact with the following medications:  acetazolamide  alcohol  ammonium chloride  antacids  ascorbic acid  atomoxetine  caffeine  certain medicines for blood pressure  certain medicines for depression, anxiety, or psychotic disturbances  certain medicines for seizures like carbamazepine, phenobarbital, phenytoin  certain medicines for stomach problems like cimetidine, ranitidine, famotidine, esomeprazole, omeprazole, lansoprazole, pantoprazole  lithium  medicines for colds and breathing difficulties  medicines for diabetes  medicines or dietary supplements for weight loss or to stay awake  methenamine  narcotic medicines for pain  quinidine  ritonavir  sodium bicarbonate  St. John's wort This list may not describe all possible interactions. Give your health care provider a list of all the medicines, herbs, non-prescription drugs, or dietary supplements you use. Also tell them if you smoke, drink alcohol, or use illegal drugs. Some items may interact with your medicine. What should I watch for while using this medicine? Visit your doctor or health care professional for regular checks on your progress. This prescription requires that you follow special procedures with your doctor and pharmacy. You will need to have a new written prescription from your doctor every time you need a refill. This medicine may affect your concentration, or hide signs of tiredness. Until you know how this medicine affects you, do not drive, ride a bicycle, use machinery, or do anything that needs mental alertness. Alcohol should be avoided with some brands of this medicine. Talk to your doctor or health care professional if you have questions. Tell your doctor or health care professional if this medicine loses its effects, or if you feel you need to take more than the prescribed amount. Do not change the  dosage without talking to your doctor or health care professional. Decreased appetite is a common side effect when starting this medicine. Eating small, frequent meals or snacks can help. Talk to your doctor if you continue to have poor eating habits. Height and weight growth of a child taking this medicine will be monitored closely. Do not take this medicine close to bedtime. It may prevent you from sleeping. If you are going to need surgery, an MRI,  a CT scan, or other procedure, tell your doctor that you are taking this medicine. You may need to stop taking this medicine before the procedure. Tell your doctor or healthcare professional right away if you notice unexplained wounds on your fingers and toes while taking this medicine. You should also tell your healthcare provider if you experience numbness or pain, changes in the skin color, or sensitivity to temperature in your fingers or toes. What side effects may I notice from receiving this medicine? Side effects that you should report to your doctor or health care professional as soon as possible:  allergic reactions like skin rash, itching or hives, swelling of the face, lips, or tongue  anxious  breathing problems  changes in emotions or moods  changes in vision  chest pain or chest tightness  fast, irregular heartbeat  fingers or toes feel numb, cool, painful  hallucination, loss of contact with reality  high blood pressure  males: prolonged or painful erection  seizures  signs and symptoms of serotonin syndrome like confusion, increased sweating, fever, tremor, stiff muscles, diarrhea  signs and symptoms of a stroke like changes in vision; confusion; trouble speaking or understanding; severe headaches; sudden numbness or weakness of the face, arm or leg; trouble walking; dizziness; loss of balance or coordination  suicidal thoughts or other mood changes  uncontrollable head, mouth, neck, arm, or leg movements Side  effects that usually do not require medical attention (report to your doctor or health care professional if they continue or are bothersome):  dry mouth  headache  irritability  loss of appetite  nausea  trouble sleeping  weight loss This list may not describe all possible side effects. Call your doctor for medical advice about side effects. You may report side effects to FDA at 1-800-FDA-1088. Where should I keep my medicine? Keep out of the reach of children. This medicine can be abused. Keep your medicine in a safe place to protect it from theft. Do not share this medicine with anyone. Selling or giving away this medicine is dangerous and against the law. Store at room temperature between 15 and 30 degrees C (59 and 86 degrees F). Keep container tightly closed. Protect from light. Throw away any unused medicine after the expiration date. NOTE: This sheet is a summary. It may not cover all possible information. If you have questions about this medicine, talk to your doctor, pharmacist, or health care provider.  2020 Elsevier/Gold Standard (2016-10-09 13:37:27)  like to proceed. Syncope Syncope is when you pass out (faint) for a short time. It is caused by a sudden decrease in blood flow to the brain. Signs that you may be about to pass out include:  Feeling dizzy or light-headed.  Feeling sick to your stomach (nauseous).  Seeing all white or all black.  Having cold, clammy skin. If you pass out, get help right away. Call your local emergency services (911 in the [place] Do not drive yourself to the hospital. Follow these instructions at home: Watch for any changes in your symptoms. Take these actions to stay safe and help with your symptoms: Lifestyle  Do not drive, use machinery, or play sports until your doctor says it is okay.  Do not drink alcohol.  Do not use any products that contain nicotine or tobacco, such as cigarettes and e-cigarettes. If you need help quitting,  ask your doctor.  Drink enough fluid to keep your pee (urine) pale yellow. General instructions  Take over-the-counter and prescription medicines only  as told by your doctor.  If you are taking blood pressure or heart medicine, sit up and stand up slowly. Spend a few minutes getting ready to sit and then stand. This can help you feel less dizzy.  Have someone stay with you until you feel stable.  If you start to feel like you might pass out, lie down right away and raise (elevate) your feet above the level of your heart. Breathe deeply and steadily. Wait until all of the symptoms are gone.  Keep all follow-up visits as told by your doctor. This is important. Get help right away if:  You have a very bad headache.  You pass out once or more than once.  You have pain in your chest, belly, or back.  You have a very fast or uneven heartbeat (palpitations).  It hurts to breathe.  You are bleeding from your mouth or your bottom (rectum).  You have black or tarry poop (stool).  You have jerky movements that you cannot control (seizure).  You are confused.  You have trouble walking.  You are very weak.  You have vision problems. These symptoms may be an emergency. Do not wait to see if the symptoms will go away. Get medical help right away. Call your local emergency services (911 in the [place] Do not drive yourself to the hospital. Summary  Syncope is when you pass out (faint) for a short time. It is caused by a sudden decrease in blood flow to the brain.  Signs that you may be about to faint include feeling dizzy, light-headed, or sick to your stomach, seeing all white or all black, or having cold, clammy skin.  If you start to feel like you might pass out, lie down right away and raise (elevate) your feet above the level of your heart. Breathe deeply and steadily. Wait until all of the symptoms are gone. This information is not intended to replace advice given to you by your  health care provider. Make sure you discuss any questions you have with your health care provider. Document Released: 02/01/2008 Document Revised: 09/27/2017 Document Reviewed: 09/27/2017 Elsevier Patient Education  2020 [employer].

## 2019-07-31 ENCOUNTER — Telehealth: Payer: Self-pay | Admitting: Adult Health

## 2019-07-31 ENCOUNTER — Telehealth: Payer: Self-pay

## 2019-07-31 ENCOUNTER — Other Ambulatory Visit: Payer: Self-pay | Admitting: Adult Health

## 2019-07-31 DIAGNOSIS — Z87438 Personal history of other diseases of male genital organs: Secondary | ICD-10-CM | POA: Insufficient documentation

## 2019-07-31 DIAGNOSIS — Z87898 Personal history of other specified conditions: Secondary | ICD-10-CM | POA: Insufficient documentation

## 2019-07-31 DIAGNOSIS — Z79899 Other long term (current) drug therapy: Secondary | ICD-10-CM | POA: Insufficient documentation

## 2019-07-31 LAB — LIPID PANEL
Chol/HDL Ratio: 4.5 ratio (ref 0.0–5.0)
Cholesterol, Total: 212 mg/dL — ABNORMAL HIGH (ref 100–199)
HDL: 47 mg/dL (ref >39)
LDL Chol Calc (NIH): 149 mg/dL — ABNORMAL HIGH (ref 0–99)
Triglycerides: 87 mg/dL (ref 0–149)
VLDL Cholesterol Cal: 16 mg/dL (ref 5–40)

## 2019-07-31 LAB — COMPREHENSIVE METABOLIC PANEL
ALT: 39 IU/L (ref 0–44)
AST: 24 IU/L (ref 0–40)
Albumin/Globulin Ratio: 1.9 (ref 1.2–2.2)
Albumin: 5.2 g/dL — ABNORMAL HIGH (ref 4.0–5.0)
Alkaline Phosphatase: 67 IU/L (ref 39–117)
BUN/Creatinine Ratio: 17 (ref 9–20)
BUN: 18 mg/dL (ref 6–20)
Bilirubin Total: 0.4 mg/dL (ref 0.0–1.2)
CO2: 24 mmol/L (ref 20–29)
Calcium: 10 mg/dL (ref 8.7–10.2)
Chloride: 100 mmol/L (ref 96–106)
Creatinine, Ser: 1.09 mg/dL (ref 0.76–1.27)
GFR calc Af Amer: 103 mL/min/{1.73_m2} (ref >59)
GFR calc non Af Amer: 89 mL/min/{1.73_m2} (ref >59)
Globulin, Total: 2.8 g/dL (ref 1.5–4.5)
Glucose: 89 mg/dL (ref 65–99)
Potassium: 4.6 mmol/L (ref 3.5–5.2)
Sodium: 139 mmol/L (ref 134–144)
Total Protein: 8 g/dL (ref 6.0–8.5)

## 2019-07-31 LAB — CBC WITH DIFFERENTIAL/PLATELET
Basophils Absolute: 0 10*3/uL (ref 0.0–0.2)
Basos: 1 %
EOS (ABSOLUTE): 0.3 10*3/uL (ref 0.0–0.4)
Eos: 4 %
Hematocrit: 43.3 % (ref 37.5–51.0)
Hemoglobin: 14.5 g/dL (ref 13.0–17.7)
Immature Grans (Abs): 0 10*3/uL (ref 0.0–0.1)
Immature Granulocytes: 0 %
Lymphocytes Absolute: 2.7 10*3/uL (ref 0.7–3.1)
Lymphs: 31 %
MCH: 31 pg (ref 26.6–33.0)
MCHC: 33.5 g/dL (ref 31.5–35.7)
MCV: 93 fL (ref 79–97)
Monocytes Absolute: 0.7 10*3/uL (ref 0.1–0.9)
Monocytes: 7 %
Neutrophils Absolute: 5.1 10*3/uL (ref 1.4–7.0)
Neutrophils: 57 %
Platelets: 376 10*3/uL (ref 150–450)
RBC: 4.67 x10E6/uL (ref 4.14–5.80)
RDW: 12.5 % (ref 11.6–15.4)
WBC: 8.9 10*3/uL (ref 3.4–10.8)

## 2019-07-31 LAB — URINALYSIS, ROUTINE W REFLEX MICROSCOPIC
Bilirubin, UA: NEGATIVE
Glucose, UA: NEGATIVE
Ketones, UA: NEGATIVE
Leukocytes,UA: NEGATIVE
Nitrite, UA: NEGATIVE
Protein,UA: NEGATIVE
RBC, UA: NEGATIVE
Specific Gravity, UA: >=1.03 — AB (ref 1.005–1.030)
Urobilinogen, Ur: 0.2 mg/dL (ref 0.2–1.0)
pH, UA: 5 (ref 5.0–7.5)

## 2019-07-31 LAB — PSA: Prostate Specific Ag, Serum: 0.3 ng/mL (ref 0.0–4.0)

## 2019-07-31 LAB — TSH: TSH: 1.67 u[IU]/mL (ref 0.450–4.500)

## 2019-07-31 NOTE — Telephone Encounter (Signed)
Patient has been advised. KW 

## 2019-07-31 NOTE — Telephone Encounter (Signed)
-----   Message from Doreen Beam, [place] sent at 07/31/2019 11:09 AM EST ----- Patient has a normal CBC, anemia.  Kidney and liver function per labs are normal.  Glucose is normal.  Prostate lab is normal.  Thyroid is normal. Total cholesterol and LDL elevated.  Discuss lifestyle modification with patient e.g. increase exercise, fiber, fruits, vegetables, lean meat, and omega 3/fish intake and decrease saturated fat.   Given his  history of tachycardia, and syncope in the past I do recommend that he follow-up with cardiology, I have placed a referral for a cardiologist evaluation.  I do not recommend stimulant use such as Adderall until he has been cleared by cardiology. Since he has been off of his Adderall XL for years, advise him to follow-up with ADHD specialist recommended.  He can schedule appointment online or call their office.  If he is unable to get in with the office below, I can refer him elsewhere. [facility] Location [facility] [phone] [address], [place], [place] [address] Phone: [phone] Fax: [phone] Hours of Operation Monday to Friday 8:00 AM - 5:00 PM Saturday and Sunday: Closed  Urology order has been placed given patient's history and he is aware of this at office visit.  Let me know if he has any questions or concerns.

## 2019-07-31 NOTE — Progress Notes (Signed)
Patient has a normal CBC, anemia.  Kidney and liver function per labs are normal.  Glucose is normal.  Prostate lab is normal.  Thyroid is normal. Total cholesterol and LDL elevated.  Discuss lifestyle modification with patient e.g. increase exercise, fiber, fruits, vegetables, lean meat, and omega 3/fish intake and decrease saturated fat.   Given his  history of tachycardia, and syncope in the past I do recommend that he follow-up with cardiology, I have placed a referral for a cardiologist evaluation.  I do not recommend stimulant use such as Adderall until he has been cleared by cardiology. Since he has been off of his Adderall XL for years, advise him to follow-up with ADHD specialist recommended.  He can schedule appointment online or call their office.  If he is unable to get in with the office below, I can refer him elsewhere. [facility] Location [facility] [phone] [address] [place], [place] [address] Phone: [phone] Fax: [phone] Hours of Operation Monday to Friday 8:00 AM - 5:00 PM Saturday and Sunday: Closed  Urology order has been placed given patient's history and he is aware of this at office visit.  Let me know if he has any questions or concerns.

## 2019-07-31 NOTE — Telephone Encounter (Signed)
Medication Refill - Medication: HYDROcodone-acetaminophen (NORCO/VICODIN) 5-325 MG tablet    Has the patient contacted their pharmacy? Yes.   (Agent: If no, request that the patient contact the pharmacy for the refill.) (Agent: If yes, when and what did the pharmacy advise?)  Preferred Pharmacy (with phone number or street name):  [facility] [place], [place] [address]  [place] [address]  Phone: [phone] Fax: [phone]     Agent: Please be advised that RX refills may take up to 3 business days. We ask that you follow-up with your pharmacy.

## 2019-08-01 NOTE — Telephone Encounter (Signed)
No refills of controlled substance sent. Please advise patient he has to follow up with [facility], as I did not see him for his neck and he has a specialist. He should call his doctor at [facility] for any refills as we discussed at the office. Persistent or chronic pain should be reevaluated by his specialist.   Thanks, Laverna Peace MSN, AGNP-C, FNP-C

## 2019-08-01 NOTE — Telephone Encounter (Signed)
From [facility] 

## 2019-08-01 NOTE — Telephone Encounter (Signed)
Patient was advised. KW 

## 2019-08-03 LAB — DRUG SCREEN 12+ALCOHOL+CRT, UR
Amphetamines, Urine: NEGATIVE ng/mL
BENZODIAZ UR QL: NEGATIVE ng/mL
Barbiturate: NEGATIVE ng/mL
Cannabinoids: NEGATIVE ng/mL
Cocaine (Metabolite): NEGATIVE ng/mL
Creatinine, Urine: 223.6 mg/dL (ref 20.0–300.0)
Ethanol, Urine: NEGATIVE %
Meperidine: NEGATIVE ng/mL
Methadone: NEGATIVE ng/mL
OPIATE SCREEN URINE: POSITIVE ng/mL — AB
Oxycodone/Oxymorphone, Urine: POSITIVE — AB
Phencyclidine: NEGATIVE ng/mL
Propoxyphene: NEGATIVE ng/mL
Tramadol: NEGATIVE ng/mL

## 2019-08-07 DIAGNOSIS — Z5181 Encounter for therapeutic drug level monitoring: Secondary | ICD-10-CM | POA: Diagnosis not present

## 2019-08-07 DIAGNOSIS — Z79899 Other long term (current) drug therapy: Secondary | ICD-10-CM | POA: Diagnosis not present

## 2019-08-07 DIAGNOSIS — M5412 Radiculopathy, cervical region: Secondary | ICD-10-CM | POA: Diagnosis not present

## 2019-08-07 DIAGNOSIS — M542 Cervicalgia: Secondary | ICD-10-CM | POA: Diagnosis not present

## 2019-08-07 MED ORDER — AMPHETAMINE-DEXTROAMPHETAMINE 20 MG PO TABS: 20.0000 mg | ORAL_TABLET | Freq: Every day | ORAL | 0 refills | Status: DC

## 2019-08-09 ENCOUNTER — Ambulatory Visit (INDEPENDENT_AMBULATORY_CARE_PROVIDER_SITE_OTHER): Payer: BC Managed Care – PPO | Admitting: Cardiology

## 2019-08-09 ENCOUNTER — Encounter: Payer: Self-pay | Admitting: Cardiology

## 2019-08-09 ENCOUNTER — Other Ambulatory Visit: Payer: Self-pay

## 2019-08-09 VITALS — BP 126/87 | HR 80 | Temp 97.6°F | Ht 65.0 in | Wt 178.8 lb

## 2019-08-09 DIAGNOSIS — R55 Syncope and collapse: Secondary | ICD-10-CM

## 2019-08-09 NOTE — Progress Notes (Addendum)
Cardiology Office Note:    Date:  08/09/2019   ID:  Eric Mccormick, DOB 12-Aug-1986, MRN [id number]  PCP:  Eric Beam, FNP  Cardiologist:  No primary care provider on file.  Electrophysiologist:  None   Referring MD: Eric Mccormick*   Chief Complaint  Patient presents with  . other    Ref by Laverna Peace for a history of syncope. Meds reviewed by the pt. verbally. "doing well."    Eric Mccormick is a 33 y.o. male who is being seen today for the evaluation of syncope at the request of Flinchum, Kelby Aline, F*.  History of Present Illness:    Eric Mccormick is a 33 y.o. male with a hx of ADHD who presents due to syncope.  The patient states his first episode of syncope was about 14 years ago.  He describes pre syncopal symptoms of nausea, blurry vision, dizziness prior to passing out.  Symptoms usually occur when he is doing something and or standing.  He was evaluated at [facility] back in 2015 for syncope.  had a stress echo which was normal.  48-hour Holter monitor did not show any evidence for arrhythmias or causes for syncope.  He states having 2 episodes right after cardiology evaluation in 2015.  He is symptoms were deemed secondary to vasovagal syncope.  He has not had any syncopal episode over the past 4 years.  He has a history of ADHD and his new primary care provider is planning on starting him on medications.  He denies any symptoms of chest pain, shortness of breath, palpitations.  Past Medical History:  Diagnosis Date  . ADHD   . Enlarged prostate   . Vasovagal syncope     Past Surgical History:  Procedure Laterality Date  . NO PAST SURGERIES      Current Medications: Current Meds  Medication Sig  . amphetamine-dextroamphetamine (ADDERALL) 20 MG tablet Take 1 tablet (20 mg total) by mouth daily.  . celecoxib (CELEBREX) 200 MG capsule Take 200 mg by mouth daily.  Marland Kitchen HYDROcodone-acetaminophen (NORCO/VICODIN) 5-325 MG tablet 1-2 tabs po qd prn  .  ibuprofen (ADVIL) 600 MG tablet Take 600 mg by mouth as needed.   . metaxalone (SKELAXIN) 800 MG tablet Take 800 mg by mouth 3 (three) times daily.     Allergies:   Other   Social History   Socioeconomic History  . Marital status: Married    Spouse name: Not on file  . Number of children: Not on file  . Years of education: Not on file  . Highest education level: Not on file  Occupational History  . Not on file  Tobacco Use  . Smoking status: Never Smoker  . Smokeless tobacco: Never Used  Substance and Sexual Activity  . Alcohol use: Not Currently  . Drug use: Not Currently  . Sexual activity: Not on file  Other Topics Concern  . Not on file  Social History Narrative  . Not on file   Social Determinants of Health   Financial Resource Strain:   . Difficulty of Paying Living Expenses: Not on file  Food Insecurity:   . Worried About Charity fundraiser in the Last Year: Not on file  . Ran Out of Food in the Last Year: Not on file  Transportation Needs:   . Lack of Transportation (Medical): Not on file  . Lack of Transportation (Non-Medical): Not on file  Physical Activity:   . Days of Exercise  per Week: Not on file  . Minutes of Exercise per Session: Not on file  Stress:   . Feeling of Stress : Not on file  Social Connections:   . Frequency of Communication with Friends and Family: Not on file  . Frequency of Social Gatherings with Friends and Family: Not on file  . Attends Religious Services: Not on file  . Active Member of Clubs or Organizations: Not on file  . Attends BankerClub or Organization Meetings: Not on file  . Marital Status: Not on file     Family History: The patient's family history includes ADD / ADHD in his daughter and son; Arthritis in his paternal grandmother; Cancer in his paternal aunt; Hyperlipidemia in his father and maternal grandmother; Hypertension in his father, maternal grandfather, maternal grandmother, maternal uncle, and mother; Macular  degeneration in his paternal grandmother.  ROS:   Please see the history of present illness.     All other systems reviewed and are negative.  EKGs/Labs/Other Studies Reviewed:    The following studies were reviewed today: Stress echo 2015 at [facility] INTERPRETATION ---------------------------------------------------------------    NORMAL STRESS TEST.  VALVULAR REGURGITATION: TRIVIAL TR  NO VALVULAR STENOSIS  Maximum workload of 13.70 METs was achieved during exercise.  NORMAL RESTING BP - APPROPRIATE RESPONSE   HR BP SYMPTOMS  Lying  82  126/84  none  Sitting  92  123/86  none  Standing (0min) 93 122/86  none  Standing (3min) 90 121/87  none    EKG:  EKG is  ordered today.  The ekg ordered today demonstrates normal sinus rhythm, normal EKG.  Recent Labs: 07/30/2019: ALT 39; BUN 18; Creatinine, Ser 1.09; Hemoglobin 14.5; Platelets 376; Potassium 4.6; Sodium 139; TSH 1.670  Recent Lipid Panel    Component Value Date/Time   CHOL 212 (H) 07/30/2019 1000   TRIG 87 07/30/2019 1000   HDL 47 07/30/2019 1000   CHOLHDL 4.5 07/30/2019 1000   LDLCALC 149 (H) 07/30/2019 1000    Physical Exam:    VS:  BP 126/87 (BP Location: Left Arm, Patient Position: Sitting, Cuff Size: Normal)   Pulse 80   Temp 97.6 F (36.4 C)   Ht 5\' 5"  (1.651 m)   Wt 178 lb 12 oz (81.1 kg)   BMI 29.75 kg/m     Wt Readings from Last 3 Encounters:  08/09/19 178 lb 12 oz (81.1 kg)  07/30/19 179 lb (81.2 kg)  04/29/19 162 lb (73.5 kg)     GEN:  Well nourished, well developed in no acute distress HEENT: Normal NECK: No JVD; No carotid bruits LYMPHATICS: No lymphadenopathy CARDIAC: RRR, no murmurs, rubs, gallops RESPIRATORY:  Clear to auscultation without rales, wheezing or rhonchi  ABDOMEN: Soft, non-tender, non-distended MUSCULOSKELETAL:  No edema; No deformity  SKIN: Warm and dry NEUROLOGIC:  Alert and oriented x 3 PSYCHIATRIC:  Normal affect   ASSESSMENT:   Patient with classic symptoms  of vasovagal /reflex syncope.  He does not have any symptoms, or history to suggest cardiac etiology.  Last stress test was within normal limits, last cardiac monitor with no evidence for arrhythmias.  Patient has not had any episodes of syncope for the last 4 years.  Orthostatic vital signs obtained in the office today did not show any evidence for orthostasis 1. Vasovagal syncope    PLAN:    In order of problems listed above:  1. Patient educated on presyncopal symptoms and the need to sit down and or avoid triggers if any.  Safety precautions also discussed.  Okay for patient to receive treatment for ADHD from a cardiac perspective.  This note was generated in part or whole with voice recognition software. Voice recognition is usually quite accurate but there are transcription errors that can and very often do occur. I apologize for any typographical errors that were not detected and corrected.   Medication Adjustments/Labs and Tests Ordered: Current medicines are reviewed at length with the patient today.  Concerns regarding medicines are outlined above.  Orders Placed This Encounter  Procedures  . EKG 12-Lead   No orders of the defined types were placed in this encounter.   Patient Instructions  Medication Instructions:  No changes  *If you need a refill on your cardiac medications before your next appointment, please call your pharmacy*  Lab Work: None  If you have labs (blood work) drawn today and your tests are completely normal, you will receive your results only by: Marland Kitchen MyChart Message (if you have MyChart) OR . A paper copy in the mail If you have any lab test that is abnormal or we need to change your treatment, we will call you to review the results.  Testing/Procedures: None  Follow-Up: At [facility], you and your health needs are our priority.  As part of our continuing mission to provide you with exceptional heart care, we have created designated Provider  Care Teams.  These Care Teams include your primary Cardiologist (physician) and Advanced Practice Providers (APPs -  Physician Assistants and Nurse Practitioners) who all work together to provide you with the care you need, when you need it.  Your next appointment:   As needed    Signed, Debbe Odea, MD  08/09/2019 8:46 AM    [facility]

## 2019-08-09 NOTE — Patient Instructions (Signed)
Medication Instructions:  No changes  *If you need a refill on your cardiac medications before your next appointment, please call your pharmacy*   Lab Work: None  If you have labs (blood work) drawn today and your tests are completely normal, you will receive your results only by: . MyChart Message (if you have MyChart) OR . A paper copy in the mail If you have any lab test that is abnormal or we need to change your treatment, we will call you to review the results.   Testing/Procedures: None   Follow-Up: At [facility], you and your health needs are our priority.  As part of our continuing mission to provide you with exceptional heart care, we have created designated Provider Care Teams.  These Care Teams include your primary Cardiologist (physician) and Advanced Practice Providers (APPs -  Physician Assistants and Nurse Practitioners) who all work together to provide you with the care you need, when you need it.   Your next appointment:   As needed 

## 2019-09-02 ENCOUNTER — Ambulatory Visit: Payer: BC Managed Care – PPO | Admitting: Adult Health

## 2019-09-02 ENCOUNTER — Other Ambulatory Visit: Payer: Self-pay

## 2019-09-02 ENCOUNTER — Encounter: Payer: Self-pay | Admitting: Adult Health

## 2019-09-02 VITALS — BP 122/86 | HR 94 | Temp 97.1°F | Resp 16 | Wt 182.6 lb

## 2019-09-02 DIAGNOSIS — Z87438 Personal history of other diseases of male genital organs: Secondary | ICD-10-CM

## 2019-09-02 DIAGNOSIS — Z79899 Other long term (current) drug therapy: Secondary | ICD-10-CM

## 2019-09-02 DIAGNOSIS — F909 Attention-deficit hyperactivity disorder, unspecified type: Secondary | ICD-10-CM | POA: Diagnosis not present

## 2019-09-02 DIAGNOSIS — Z87898 Personal history of other specified conditions: Secondary | ICD-10-CM

## 2019-09-02 MED ORDER — AMPHETAMINE-DEXTROAMPHET ER 20 MG PO CP24: 20.0000 mg | ORAL_CAPSULE | ORAL | 0 refills | Status: DC

## 2019-09-02 NOTE — Progress Notes (Signed)
Patient: Eric Mccormick Male    DOB: 31-Mar-1986   34 y.o.   MRN: [id number] Visit Date: 09/02/2019  Today's Provider: Marcille Buffy, FNP   Chief Complaint  Patient presents with  . ADHD   Subjective:     HPI  Follow up for ADHD  The patient was last seen for this 1 months ago. Changes made at last visit include patient was started back on Adderrall 20mg .  He reports good compliance with treatment. He feels that condition is Unchanged. Patient states that he has not seen [facility] yet, patient would like to discuss being on Adderall ER He is not having side effects.   He reports he is doing well. Denies any concerns.  He was seen by cardiology and return on PRN basis for his history of syncope thought to be vasovagal.   He still has urinary urgency. PSA was normal. This has been a chronic issue and he is going to see urology on 09/03/2019.  He has had history of prostatitis in past.    He denies any other concerns at this visit.   Patient  denies any fever, body aches,chills, rash, chest pain, shortness of breath, nausea, vomiting, or diarrhea.   Denies any recreational drug use. Rare alcohol.  Sees Rhys Martini at [facility] for neck pain has oxycodone Acetaminophen 5 mg/325mg  he takes as needed.     ------------------------------------------------------------------------------------  Allergies  Allergen Reactions  . Other Nausea And Vomiting    [place]     Current Outpatient Medications:  .  amphetamine-dextroamphetamine (ADDERALL) 20 MG tablet, Take 1 tablet (20 mg total) by mouth daily., Disp: 30 tablet, Rfl: 0 .  celecoxib (CELEBREX) 200 MG capsule, Take 200 mg by mouth daily., Disp: , Rfl:  .  metaxalone (SKELAXIN) 800 MG tablet, Take 800 mg by mouth 3 (three) times daily., Disp: , Rfl:  .  oxyCODONE-acetaminophen (PERCOCET/ROXICET) 5-325 MG tablet, Take 1 tablet by mouth 3 (three) times daily., Disp: , Rfl:  .   ibuprofen (ADVIL) 600 MG tablet, Take 600 mg by mouth as needed. , Disp: , Rfl:  .  meloxicam (MOBIC) 15 MG tablet, Take 1 tablet (15 mg total) by mouth daily. (Patient not taking: Reported on 09/02/2019), Disp: 30 tablet, Rfl: 0 .  naproxen (NAPROSYN) 500 MG tablet, Take 1 tablet (500 mg total) by mouth 2 (two) times daily with a meal. (Patient not taking: Reported on 09/02/2019), Disp: 30 tablet, Rfl: 0  Review of Systems  Constitutional: Negative.   HENT: Negative.   Eyes: Negative.   Respiratory: Negative for apnea, cough, choking, chest tightness, shortness of breath, wheezing and stridor.   Cardiovascular: Negative.   Gastrointestinal: Negative.   Endocrine: Negative.   Genitourinary: Positive for urgency (chronic sees urology 09/03/2019). Negative for decreased urine volume, difficulty urinating, discharge, dysuria, enuresis, flank pain, frequency, genital sores, hematuria, penile pain, penile swelling, scrotal swelling and testicular pain.  Musculoskeletal: Negative.   Skin: Negative.   Neurological: Negative.   Hematological: Negative.   Psychiatric/Behavioral: Negative for agitation, behavioral problems, confusion, dysphoric mood, hallucinations, self-injury, sleep disturbance and suicidal ideas. Decreased concentration: improving  The patient is not nervous/anxious and is not hyperactive.     Social History   Tobacco Use  . Smoking status: Never Smoker  . Smokeless tobacco: Never Used  Substance Use Topics  . Alcohol use: Not Currently      Objective:   BP 122/86   Pulse 94  Temp (!) 97.1 F (36.2 C) (Oral)   Resp 16   Wt 182 lb 9.6 oz (82.8 kg)   SpO2 97%   BMI 30.39 kg/m  Vitals:   09/02/19 0811  BP: 122/86  Pulse: 94  Resp: 16  Temp: (!) 97.1 F (36.2 C)  TempSrc: Oral  SpO2: 97%  Weight: 182 lb 9.6 oz (82.8 kg)  Body mass index is 30.39 kg/m.   Physical Exam Vitals reviewed.  Constitutional:      General: He is not in acute distress.    Appearance:  Normal appearance. He is not ill-appearing, toxic-appearing or diaphoretic.  HENT:     Head: Normocephalic and atraumatic.     Right Ear: There is no impacted cerumen.     Left Ear: There is no impacted cerumen.     Mouth/Throat:     Mouth: Mucous membranes are moist.  Eyes:     General: No scleral icterus.       Right eye: No discharge.        Left eye: No discharge.     Extraocular Movements: Extraocular movements intact.     Conjunctiva/sclera: Conjunctivae normal.     Pupils: Pupils are equal, round, and reactive to light.  Neck:     Vascular: No carotid bruit.  Cardiovascular:     Rate and Rhythm: Normal rate and regular rhythm.     Pulses: Normal pulses.     Heart sounds: Normal heart sounds. No murmur. No friction rub.  Pulmonary:     Effort: Pulmonary effort is normal. No respiratory distress.     Breath sounds: Normal breath sounds. No stridor. No wheezing, rhonchi or rales.  Chest:     Chest wall: No tenderness.  Abdominal:     General: There is no distension.     Palpations: Abdomen is soft. There is no mass.     Tenderness: There is no abdominal tenderness. There is no right CVA tenderness, left CVA tenderness, guarding or rebound.     Hernia: No hernia is present.  Musculoskeletal:        General: Normal range of motion.     Cervical back: Normal range of motion and neck supple. No rigidity or tenderness.  Lymphadenopathy:     Cervical: No cervical adenopathy.  Skin:    General: Skin is warm and dry.     Capillary Refill: Capillary refill takes less than 2 seconds.  Neurological:     Mental Status: He is alert and oriented to person, place, and time.  Psychiatric:        Mood and Affect: Mood normal.        Behavior: Behavior normal.        Thought Content: Thought content normal.        Judgment: Judgment normal.      No results found for any visits on 09/02/19.     Assessment & Plan    1. H/O urinary frequency No symptoms reported currently.  Reviewed with patient.   2. History of acute prostatitis Keep appointment with urology on 09/03/2019  3. Long-term current use of stimulant Discussed side effects. He will see [place] attention specialist. He has been referred at last visit. CMA sending another request to referrals and patient also given contact information for this.   CONTACT INFORMATION [facility] [phone] [address] [place], [place] [address]  Phone: [phone] Fax: [phone]  Hours of Operation Monday to Friday 8:00 AM - 5:00 PM  Saturday and Sunday: Close  4. Attention deficit hyperactivity disorder (ADHD), unspecified ADHD type  Refills given as below. He will call when he is out. He will call if any unusual side effects.  Follow up in 3- 4 months with repeat office visit;.  Meds ordered this encounter  Medications  . amphetamine-dextroamphetamine (ADDERALL XR) 20 MG 24 hr capsule    Sig: Take 1 capsule (20 mg total) by mouth every morning.    Dispense:  30 capsule    Refill:  0    1st script  . amphetamine-dextroamphetamine (ADDERALL XR) 20 MG 24 hr capsule    Sig: Take 1 capsule (20 mg total) by mouth every morning.    Dispense:  30 capsule    Refill:  0    Do not fill 3rd script until 30 days past 2nd script  . amphetamine-dextroamphetamine (ADDERALL XR) 20 MG 24 hr capsule    Sig: Take 1 capsule (20 mg total) by mouth every morning.    Dispense:  30 capsule    Refill:  0    Do not fill second script until 30 days past 1st script   Return in about 4 months (around 12/31/2019), or if symptoms worsen or fail to improve, for at any time for any worsening symptoms, Go to Emergency room/ urgent care if worse.  Advised patient call the office or your primary care doctor for an appointment if no improvement within 72 hours or if any symptoms change or worsen at any time  Advised ER or urgent Care if after hours or on weekend. Call 911 for emergency symptoms at  any time.Patinet verbalized understanding of all instructions given/reviewed and treatment plan and has no further questions or concerns at this time.    The entirety of the information documented in the History of Present Illness, Review of Systems and Physical Exam were personally obtained by me. Portions of this information were initially documented by the  Certified Medical Assistant whose name is documented in Epic and reviewed by me for thoroughness and accuracy.  I have personally performed the exam and reviewed the chart and it is accurate to the best of my knowledge.  Museum/gallery conservator has been used and any errors in dictation or transcription are unintentional.  Eula Fried. Flinchum FNP-C  [facility]     Jairo Ben, FNP  [facility]

## 2019-09-02 NOTE — Patient Instructions (Addendum)
CONTACT INFORMATION [facility] [phone] [address] [place], [place] [address]  Phone: [phone] Fax: [phone]  Hours of Operation Monday to Friday 8:00 AM - 5:00 PM Saturday and Sunday: Close     Attention Deficit Hyperactivity Disorder, Adult Attention deficit hyperactivity disorder (ADHD) is a mental health disorder that starts during childhood (neurodevelopmental disorder). For many people with ADHD, the disorder continues into the adult years. Treatment can help you manage your symptoms. What are the causes? The exact cause of ADHD is not known. Most experts believe genetics and environmental factors contribute to ADHD. What increases the risk? The following factors may make you more likely to develop this condition:  Having a family history of ADHD.  Being male.  Being born to a mother who smoked or drank alcohol during pregnancy.  Being exposed to lead or other toxins in the womb or early in life.  Being born before 36 weeks of pregnancy (prematurely) or at a low birth weight.  Having experienced a brain injury. What are the signs or symptoms? Symptoms of this condition depend on the type of ADHD. The two main types are inattentive and hyperactive-impulsive. Some people may have symptoms of both types. Symptoms of the inattentive type include:  Difficulty paying attention.  Making careless mistakes.  Not following instructions.  Being disorganized.  Avoiding tasks that require time and attention.  Losing and forgetting things.  Being easily distracted. Symptoms of the hyperactive-impulsive type include:  Restlessness.  Talking too much.  Interrupting.  Difficulty with: ? Sitting still. ? Feeling motivated. ? Relaxing. ? Waiting in line or waiting for a turn. In adults, this condition may lead to certain problems, such as:  Keeping jobs.  Performing tasks at work.  Having stable  relationships.  Being on time or keeping to a schedule. How is this diagnosed? This condition is diagnosed based on your current symptoms and your history of symptoms. The diagnosis can be made by a health care provider such as a primary care provider or a mental health care specialist. Your health care provider may use a symptom checklist or a behavior rating scale to evaluate your symptoms. He or she may also want to talk with people who have observed your behaviors throughout your life. How is this treated? This condition can be treated with medicines and behavior therapy. Medicines may be the best option to reduce impulsive behaviors and improve attention. Your health care provider may recommend:  Stimulant medicines. These are the most common medicines used for adult ADHD. They affect certain chemicals in the brain (neurotransmitters) and improve your ability to control your symptoms.  A non-stimulant medicine for adult ADHD (atomoxetine). This medicine increases a neurotransmitter called norepinephrine. It may take weeks to months to see effects from this medicine. Counseling and behavioral management are also important for treating ADHD. Counseling is often used along with medicine. Your health care provider may suggest:  Cognitive behavioral therapy (CBT). This type of therapy teaches you to replace negative thoughts and actions with positive thoughts and actions. When used as part of ADHD treatment, this therapy may also include: ? Coping strategies for organization, time management, impulse control, and stress reduction. ? Mindfulness and meditation training.  Behavioral management. You may work with a [facility] [facility] manager who is specially trained to help people with ADHD manage and organize activities and function more effectively. Follow these instructions at home: Medicines   Take over-the-counter and prescription medicines only as told by your health care  provider.  Talk with your health care  provider about the possible side effects of your medicines and how to manage them. Lifestyle   Do not use drugs.  Do not drink alcohol if: ? Your health care provider tells you not to drink. ? You are pregnant, may be pregnant, or are planning to become pregnant.  If you drink alcohol: ? Limit how much you use to:  0-1 drink a day for women.  0-2 drinks a day for men. ? Be aware of how much alcohol is in your drink. In the [place] one drink equals one 12 oz bottle of beer (355 mL), one 5 oz glass of wine (148 mL), or one 1 oz glass of hard liquor (44 mL).  Get enough sleep.  Eat a healthy diet.  Exercise regularly. Exercise can help to reduce stress and anxiety. General instructions  Learn as much as you can about adult ADHD, and work closely with your health care providers to find the treatments that work best for you.  Follow the same schedule each day.  Use reminder devices like notes, calendars, and phone apps to stay on time and organized.  Keep all follow-up visits as told by your health care provider and therapist. This is important. Where to find more information A health care provider may be able to recommend resources that are available online or over the phone. You could start with:  Attention Deficit Disorder Association (ADDA): http://davis-dillon.net/  [employer] of Mental Health [facility]): http://www.maynard.net/ Contact a health care provider if:  Your symptoms continue to cause problems.  You have side effects from your medicine, such as: ? Repeated muscle twitches, coughing, or speech outbursts. ? Sleep problems. ? Loss of appetite. ? Dizziness. ? Unusually fast heartbeat. ? Stomach pains. ? Headaches.  You are struggling with anxiety, depression, or substance abuse. Get help right away if you:  Have a severe reaction to a medicine. If you ever feel like you may hurt yourself or others, or have thoughts about taking your own life, get help right away. You can  go to the nearest emergency department or call:  Your local emergency services (911 in the [place]  A suicide crisis helpline, such as the National Suicide Prevention Lifeline at (812)552-1619. This is open 24 hours a day. Summary  ADHD is a mental health disorder that starts during childhood (neurodevelopmental disorder) and often continues into the adult years.  The exact cause of ADHD is not known. Most experts believe genetics and environmental factors contribute to ADHD.  There is no cure for ADHD, but treatment with medicine, cognitive behavioral therapy, or behavioral management can help you manage your condition. This information is not intended to replace advice given to you by your health care provider. Make sure you discuss any questions you have with your health care provider. Document Revised: 01/07/2019 Document Reviewed: 01/07/2019 Elsevier Patient Education  2020 [employer] Amphetamine; Dextroamphetamine extended-release capsules What is this medicine? AMPHETAMINE; DEXTROAMPHETAMINE (am FET a meen; dex troe am FET a meen) is used to treat attention-deficit hyperactivity disorder (ADHD). Federal law prohibits giving this medicine to any person other than the person for whom it was prescribed. Do not share this medicine with anyone else. This medicine may be used for other purposes; ask your health care provider or pharmacist if you have questions. COMMON BRAND NAME(S): Adderall XR, Mydayis What should I tell my health care provider before I take this medicine? They need to know if you have any  of these conditions:  anxiety or panic attacks  circulation problems in fingers and toes  glaucoma  hardening or blockages of the arteries or heart blood vessels  heart disease or a heart defect  high blood pressure  history of a drug or alcohol abuse problem  history of stroke  kidney disease  liver disease  mental illness  seizures  suicidal thoughts, plans,  or attempt; a previous suicide attempt by you or a family member  thyroid disease  Tourette's syndrome  an unusual or allergic reaction to dextroamphetamine, other amphetamines, other medicines, foods, dyes, or preservatives  pregnant or trying to get pregnant  breast-feeding How should I use this medicine? Take this medicine by mouth with a glass of water. Follow the directions on the prescription label. This medicine is taken just one time per day, usually in the morning after waking up. Take with or without food. Do not chew or crush this medicine. You may open the capsules and sprinkle the medicine on a spoonful of applesauce. If sprinkled on applesauce, take the dose immediately and do not crush or chew. Do not take your medicine more often than directed. A special MedGuide will be given to you by the pharmacist with each prescription and refill. Be sure to read this information carefully each time. Talk to your pediatrician regarding the use of this medicine in children. While this drug may be prescribed for children as young as 6 years for selected conditions, precautions do apply. Some extended-release capsules are recommended for use only in children 60 years of age and older. Overdosage: If you think you have taken too much of this medicine contact a poison control center or emergency room at once. NOTE: This medicine is only for you. Do not share this medicine with others. What if I miss a dose? If you miss a dose, take it as soon as you can in the morning, but do not take it later in the day because it can cause trouble sleeping. If it is almost time for your next dose, take only that dose. Do not take double or extra doses. What may interact with this medicine? Do not take this medicine with any of the following medications:  MAOIs like Carbex, Eldepryl, Marplan, Nardil, and Parnate  other stimulant medicines for attention disorders This medicine may also interact with the  following medications:  acetazolamide  alcohol  ammonium chloride  antacids  ascorbic acid  atomoxetine  caffeine  certain medicines for blood pressure  certain medicines for depression, anxiety, or psychotic disturbances  certain medicines for seizures like carbamazepine, phenobarbital, phenytoin  certain medicines for stomach problems like cimetidine, ranitidine, famotidine, esomeprazole, omeprazole, lansoprazole, pantoprazole  lithium  medicines for colds and breathing difficulties  medicines for diabetes  medicines or dietary supplements for weight loss or to stay awake  methenamine  narcotic medicines for pain  quinidine  ritonavir  sodium bicarbonate  St. John's wort This list may not describe all possible interactions. Give your health care provider a list of all the medicines, herbs, non-prescription drugs, or dietary supplements you use. Also tell them if you smoke, drink alcohol, or use illegal drugs. Some items may interact with your medicine. What should I watch for while using this medicine? Visit your doctor or health care professional for regular checks on your progress. This prescription requires that you follow special procedures with your doctor and pharmacy. You will need to have a new written prescription from your doctor every time you need  a refill. This medicine may affect your concentration, or hide signs of tiredness. Until you know how this medicine affects you, do not drive, ride a bicycle, use machinery, or do anything that needs mental alertness. Alcohol should be avoided with some brands of this medicine. Talk to your doctor or health care professional if you have questions. Tell your doctor or health care professional if this medicine loses its effects, or if you feel you need to take more than the prescribed amount. Do not change the dosage without talking to your doctor or health care professional. Decreased appetite is a common side  effect when starting this medicine. Eating small, frequent meals or snacks can help. Talk to your doctor if you continue to have poor eating habits. Height and weight growth of a child taking this medicine will be monitored closely. Do not take this medicine close to bedtime. It may prevent you from sleeping. If you are going to need surgery, an MRI, a CT scan, or other procedure, tell your doctor that you are taking this medicine. You may need to stop taking this medicine before the procedure. Tell your doctor or healthcare professional right away if you notice unexplained wounds on your fingers and toes while taking this medicine. You should also tell your healthcare provider if you experience numbness or pain, changes in the skin color, or sensitivity to temperature in your fingers or toes. What side effects may I notice from receiving this medicine? Side effects that you should report to your doctor or health care professional as soon as possible:  allergic reactions like skin rash, itching or hives, swelling of the face, lips, or tongue  anxious  breathing problems  changes in emotions or moods  changes in vision  chest pain or chest tightness  fast, irregular heartbeat  fingers or toes feel numb, cool, painful  hallucination, loss of contact with reality  high blood pressure  males: prolonged or painful erection  seizures  signs and symptoms of serotonin syndrome like confusion, increased sweating, fever, tremor, stiff muscles, diarrhea  signs and symptoms of a stroke like changes in vision; confusion; trouble speaking or understanding; severe headaches; sudden numbness or weakness of the face, arm or leg; trouble walking; dizziness; loss of balance or coordination  suicidal thoughts or other mood changes  uncontrollable head, mouth, neck, arm, or leg movements Side effects that usually do not require medical attention (report to your doctor or health care professional if  they continue or are bothersome):  dry mouth  headache  irritability  loss of appetite  nausea  trouble sleeping  weight loss This list may not describe all possible side effects. Call your doctor for medical advice about side effects. You may report side effects to FDA at 1-800-FDA-1088. Where should I keep my medicine? Keep out of the reach of children. This medicine can be abused. Keep your medicine in a safe place to protect it from theft. Do not share this medicine with anyone. Selling or giving away this medicine is dangerous and against the law. Store at room temperature between 15 and 30 degrees C (59 and 86 degrees F). Keep container tightly closed. Protect from light. Throw away any unused medicine after the expiration date. NOTE: This sheet is a summary. It may not cover all possible information. If you have questions about this medicine, talk to your doctor, pharmacist, or health care provider.  2020 Elsevier/Gold Standard (2016-10-09 13:37:27)

## 2019-09-03 DIAGNOSIS — R350 Frequency of micturition: Secondary | ICD-10-CM | POA: Diagnosis not present

## 2019-09-03 DIAGNOSIS — N3281 Overactive bladder: Secondary | ICD-10-CM | POA: Diagnosis not present

## 2019-09-06 ENCOUNTER — Encounter: Payer: Self-pay | Admitting: Adult Health

## 2019-09-16 DIAGNOSIS — M5412 Radiculopathy, cervical region: Secondary | ICD-10-CM | POA: Diagnosis not present

## 2019-09-16 DIAGNOSIS — M542 Cervicalgia: Secondary | ICD-10-CM | POA: Diagnosis not present

## 2019-09-16 DIAGNOSIS — M47812 Spondylosis without myelopathy or radiculopathy, cervical region: Secondary | ICD-10-CM | POA: Insufficient documentation

## 2019-10-15 DIAGNOSIS — M5011 Cervical disc disorder with radiculopathy,  high cervical region: Secondary | ICD-10-CM | POA: Diagnosis not present

## 2019-10-15 DIAGNOSIS — M50123 Cervical disc disorder at C6-C7 level with radiculopathy: Secondary | ICD-10-CM | POA: Diagnosis not present

## 2019-10-15 DIAGNOSIS — M5412 Radiculopathy, cervical region: Secondary | ICD-10-CM | POA: Diagnosis not present

## 2019-10-15 DIAGNOSIS — R609 Edema, unspecified: Secondary | ICD-10-CM | POA: Diagnosis not present

## 2019-10-25 DIAGNOSIS — M542 Cervicalgia: Secondary | ICD-10-CM | POA: Diagnosis not present

## 2019-10-25 DIAGNOSIS — M47812 Spondylosis without myelopathy or radiculopathy, cervical region: Secondary | ICD-10-CM | POA: Diagnosis not present

## 2019-10-25 DIAGNOSIS — M5412 Radiculopathy, cervical region: Secondary | ICD-10-CM | POA: Diagnosis not present

## 2019-10-30 DIAGNOSIS — M47812 Spondylosis without myelopathy or radiculopathy, cervical region: Secondary | ICD-10-CM | POA: Diagnosis not present

## 2019-11-09 ENCOUNTER — Ambulatory Visit: Payer: BC Managed Care – PPO | Attending: Internal Medicine

## 2019-11-09 DIAGNOSIS — Z23 Encounter for immunization: Secondary | ICD-10-CM

## 2019-11-09 NOTE — Progress Notes (Signed)
   [facility]  Name:  Eric Mccormick    MRN: [id number] DOB: [birth year]  11/09/2019  [name] was observed post Covid-19 immunization for 15 minutes without incident. He was provided with Vaccine Information Sheet and instruction to access the V-Safe system.   [name] was instructed to call 911 with any severe reactions post vaccine: Marland Kitchen Difficulty breathing  . Swelling of face and throat  . A fast heartbeat  . A bad rash all over body  . Dizziness and weakness   Immunizations Administered    Name Date Dose VIS Date Route   Moderna COVID-19 Vaccine 11/09/2019  2:05 PM 0.5 mL 07/30/2019 Intramuscular   Manufacturer: Moderna   Lot: [id number]   NDC: 69409-828-67
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
## 2019-12-31 ENCOUNTER — Encounter: Payer: Self-pay | Admitting: Adult Health

## 2019-12-31 ENCOUNTER — Other Ambulatory Visit: Payer: Self-pay

## 2019-12-31 ENCOUNTER — Ambulatory Visit (INDEPENDENT_AMBULATORY_CARE_PROVIDER_SITE_OTHER): Payer: 59 | Admitting: Adult Health

## 2019-12-31 VITALS — BP 124/84 | HR 87 | Temp 96.2°F | Resp 16 | Wt 166.0 lb

## 2019-12-31 DIAGNOSIS — F909 Attention-deficit hyperactivity disorder, unspecified type: Secondary | ICD-10-CM

## 2019-12-31 DIAGNOSIS — G8929 Other chronic pain: Secondary | ICD-10-CM

## 2019-12-31 DIAGNOSIS — M79674 Pain in right toe(s): Secondary | ICD-10-CM | POA: Insufficient documentation

## 2019-12-31 DIAGNOSIS — M545 Low back pain: Secondary | ICD-10-CM

## 2019-12-31 DIAGNOSIS — Z79899 Other long term (current) drug therapy: Secondary | ICD-10-CM | POA: Diagnosis not present

## 2019-12-31 MED ORDER — PREDNISONE 10 MG PO TABS: ORAL_TABLET | ORAL | 0 refills | Status: AC

## 2019-12-31 MED ORDER — TRAMADOL HCL 50 MG PO TABS: 50.0000 mg | ORAL_TABLET | ORAL | 0 refills | Status: AC

## 2019-12-31 MED ORDER — AMPHETAMINE-DEXTROAMPHET ER 15 MG PO CP24: 15.0000 mg | ORAL_CAPSULE | ORAL | 0 refills | Status: DC

## 2019-12-31 NOTE — Patient Instructions (Signed)
Reminder: Do not take NSAIDS while on Prednisone, you can Take Tylenol PRN for  Pain relief  Prednisone tablets What is this medicine? PREDNISONE (PRED ni sone) is a corticosteroid. It is commonly used to treat inflammation of the skin, joints, lungs, and other organs. Common conditions treated include asthma, allergies, and arthritis. It is also used for other conditions, such as blood disorders and diseases of the adrenal glands. This medicine may be used for other purposes; ask your health care provider or pharmacist if you have questions. COMMON BRAND NAME(S): Deltasone, Predone, Sterapred, Sterapred DS What should I tell my health care provider before I take this medicine? They need to know if you have any of these conditions:  Cushing's syndrome  diabetes  glaucoma  heart disease  high blood pressure  infection (especially a virus infection such as chickenpox, cold sores, or herpes)  kidney disease  liver disease  mental illness  myasthenia gravis  osteoporosis  seizures  stomach or intestine problems  thyroid disease  an unusual or allergic reaction to lactose, prednisone, other medicines, foods, dyes, or preservatives  pregnant or trying to get pregnant  breast-feeding How should I use this medicine? Take this medicine by mouth with a glass of water. Follow the directions on the prescription label. Take this medicine with food. If you are taking this medicine once a day, take it in the morning. Do not take more medicine than you are told to take. Do not suddenly stop taking your medicine because you may develop a severe reaction. Your doctor will tell you how much medicine to take. If your doctor wants you to stop the medicine, the dose may be slowly lowered over time to avoid any side effects. Talk to your pediatrician regarding the use of this medicine in children. Special care may be needed. Overdosage: If you think you have taken too much of this medicine  contact a poison control center or emergency room at once. NOTE: This medicine is only for you. Do not share this medicine with others. What if I miss a dose? If you miss a dose, take it as soon as you can. If it is almost time for your next dose, talk to your doctor or health care professional. You may need to miss a dose or take an extra dose. Do not take double or extra doses without advice. What may interact with this medicine? Do not take this medicine with any of the following medications:  metyrapone  mifepristone This medicine may also interact with the following medications:  aminoglutethimide  amphotericin B  aspirin and aspirin-like medicines  barbiturates  certain medicines for diabetes, like glipizide or glyburide  cholestyramine  cholinesterase inhibitors  cyclosporine  digoxin  diuretics  ephedrine  male hormones, like estrogens and birth control pills  isoniazid  ketoconazole  NSAIDS, medicines for pain and inflammation, like ibuprofen or naproxen  phenytoin  rifampin  toxoids  vaccines  warfarin This list may not describe all possible interactions. Give your health care provider a list of all the medicines, herbs, non-prescription drugs, or dietary supplements you use. Also tell them if you smoke, drink alcohol, or use illegal drugs. Some items may interact with your medicine. What should I watch for while using this medicine? Visit your doctor or health care professional for regular checks on your progress. If you are taking this medicine over a prolonged period, carry an identification card with your name and address, the type and dose of your medicine, and your  doctor's name and address. This medicine may increase your risk of getting an infection. Tell your doctor or health care professional if you are around anyone with measles or chickenpox, or if you develop sores or blisters that do not heal properly. If you are going to have surgery,  tell your doctor or health care professional that you have taken this medicine within the last twelve months. Ask your doctor or health care professional about your diet. You may need to lower the amount of salt you eat. This medicine may increase blood sugar. Ask your healthcare provider if changes in diet or medicines are needed if you have diabetes. What side effects may I notice from receiving this medicine? Side effects that you should report to your doctor or health care professional as soon as possible:  allergic reactions like skin rash, itching or hives, swelling of the face, lips, or tongue  changes in emotions or moods  changes in vision  depressed mood  eye pain  fever or chills, cough, sore throat, pain or difficulty passing urine  signs and symptoms of high blood sugar such as being more thirsty or hungry or having to urinate more than normal. You may also feel very tired or have blurry vision.  swelling of ankles, feet Side effects that usually do not require medical attention (report to your doctor or health care professional if they continue or are bothersome):  confusion, excitement, restlessness  headache  nausea, vomiting  skin problems, acne, thin and shiny skin  trouble sleeping  weight gain This list may not describe all possible side effects. Call your doctor for medical advice about side effects. You may report side effects to FDA at 1-800-FDA-1088. Where should I keep my medicine? Keep out of the reach of children. Store at room temperature between 15 and 30 degrees C (59 and 86 degrees F). Protect from light. Keep container tightly closed. Throw away any unused medicine after the expiration date. NOTE: This sheet is a summary. It may not cover all possible information. If you have questions about this medicine, talk to your doctor, pharmacist, or health care provider.  2020 Elsevier/Gold Standard (2018-05-15 10:54:22) Chronic Back Pain When back  pain lasts longer than 3 months, it is called chronic back pain.The cause of your back pain may not be known. Some common causes include:  Wear and tear (degenerative disease) of the bones, ligaments, or disks in your back.  Inflammation and stiffness in your back (arthritis). People who have chronic back pain often go through certain periods in which the pain is more intense (flare-ups). Many people can learn to manage the pain with home care. Follow these instructions at home: Pay attention to any changes in your symptoms. Take these actions to help with your pain: Activity   Avoid bending and other activities that make the problem worse.  Maintain a proper position when standing or sitting: ? When standing, keep your upper back and neck straight, with your shoulders pulled back. Avoid slouching. ? When sitting, keep your back straight and relax your shoulders. Do not round your shoulders or pull them backward.  Do not sit or stand in one place for long periods of time.  Take brief periods of rest throughout the day. This will reduce your pain. Resting in a lying or standing position is usually better than sitting to rest.  When you are resting for longer periods, mix in some mild activity or stretching between periods of rest. This will help to  prevent stiffness and pain.  Get regular exercise. Ask your health care provider what activities are safe for you.  Do not lift anything that is heavier than 10 lb (4.5 kg). Always use proper lifting technique, which includes: ? Bending your knees. ? Keeping the load close to your body. ? Avoiding twisting.  Sleep on a firm mattress in a comfortable position. Try lying on your side with your knees slightly bent. If you lie on your back, put a pillow under your knees. Managing pain  If directed, apply ice to the painful area. Your health care provider may recommend applying ice during the first 24-48 hours after a flare-up begins. ? Put  ice in a plastic bag. ? Place a towel between your skin and the bag. ? Leave the ice on for 20 minutes, 2-3 times per day.  If directed, apply heat to the affected area as often as told by your health care provider. Use the heat source that your health care provider recommends, such as a moist heat pack or a heating pad. ? Place a towel between your skin and the heat source. ? Leave the heat on for 20-30 minutes. ? Remove the heat if your skin turns bright red. This is especially important if you are unable to feel pain, heat, or cold. You may have a greater risk of getting burned.  Try soaking in a warm tub.  Take over-the-counter and prescription medicines only as told by your health care provider.  Keep all follow-up visits as told by your health care provider. This is important. Contact a health care provider if:  You have pain that is not relieved with rest or medicine. Get help right away if:  You have weakness or numbness in one or both of your legs or feet.  You have trouble controlling your bladder or your bowels.  You have nausea or vomiting.  You have pain in your abdomen.  You have shortness of breath or you faint. This information is not intended to replace advice given to you by your health care provider. Make sure you discuss any questions you have with your health care provider. Document Revised: 12/06/2018 Document Reviewed: 02/22/2017 Elsevier Patient Education  2020 [employer].

## 2020-01-16 ENCOUNTER — Other Ambulatory Visit: Payer: Self-pay | Admitting: Adult Health

## 2020-04-20 ENCOUNTER — Other Ambulatory Visit: Payer: Self-pay | Admitting: Adult Health

## 2020-04-21 MED ORDER — AMPHETAMINE-DEXTROAMPHET ER 15 MG PO CP24: 15.0000 mg | ORAL_CAPSULE | ORAL | 0 refills | Status: DC

## 2020-05-01 ENCOUNTER — Encounter: Payer: Self-pay | Admitting: Adult Health

## 2020-05-06 NOTE — Telephone Encounter (Signed)
Copied from CRM [id number]. Topic: General - Inquiry >> May 05, 2020 12:54 PM Eric Mccormick wrote: Reason for CRM: patient would like a call back from any doctor that can help him because his PCP is out of the office. He has some medication questions. Call him back at [phone]. Please advise the patient

## 2020-05-08 ENCOUNTER — Other Ambulatory Visit: Payer: Self-pay

## 2020-05-08 ENCOUNTER — Ambulatory Visit: Payer: 59 | Admitting: Physician Assistant

## 2020-05-08 ENCOUNTER — Encounter: Payer: Self-pay | Admitting: Physician Assistant

## 2020-05-08 VITALS — BP 118/72 | HR 95 | Temp 98.8°F | Wt 166.8 lb

## 2020-05-08 DIAGNOSIS — M47812 Spondylosis without myelopathy or radiculopathy, cervical region: Secondary | ICD-10-CM

## 2020-05-08 DIAGNOSIS — Z23 Encounter for immunization: Secondary | ICD-10-CM

## 2020-05-08 DIAGNOSIS — M5412 Radiculopathy, cervical region: Secondary | ICD-10-CM

## 2020-05-08 MED ORDER — TRAMADOL HCL 50 MG PO TABS: 50.0000 mg | ORAL_TABLET | Freq: Three times a day (TID) | ORAL | 0 refills | Status: AC | PRN

## 2020-05-08 NOTE — Telephone Encounter (Signed)
Patient called and scheduled appointment for 2:20 today,05/08/2020 w/ Adriana.

## 2020-05-08 NOTE — Progress Notes (Signed)
Established patient visit   Patient: Eric Mccormick   DOB: December 16, 1985   34 y.o. Male  MRN: [id number] Visit Date: 05/08/2020  Today's healthcare provider: Trey Sailors, PA-C   Chief Complaint  Patient presents with  . Back Pain  I,Porsha C McClurkin,acting as a scribe for Trey Sailors, PA-C.,have documented all relevant documentation on the behalf of Trey Sailors, PA-C,as directed by  Trey Sailors, PA-C while in the presence of Trey Sailors, PA-C.  Subjective    Back Pain This is a recurrent problem. The current episode started more than 1 year ago. The problem occurs intermittently. The problem is unchanged. The pain is present in the lumbar spine. The quality of the pain is described as aching, burning, cramping, shooting and stabbing. The pain does not radiate. The pain is at a severity of 7/10. The pain is moderate. The pain is the same all the time. The symptoms are aggravated by bending, lying down, sitting, standing, twisting and position. Stiffness is present in the morning. Pertinent negatives include no abdominal pain, chest pain, leg pain, numbness, paresthesias, perianal numbness, tingling or weakness. He has tried heat, ice, muscle relaxant and NSAIDs for the symptoms. The treatment provided mild relief.    From [facility] note on 04/23/2020  Jaquel Glassburn is a 34 y.o. male who presents with a chief complaint of cervical and low back pain. Regarding his neck pain, it started in January 2021 without any inciting events. He describes neck pain with lateral flexion and rotation. He does endorse some radiating pain bilaterally to the top of his shoulders and occiput. He denies any radiating pain down his upper extremities or associated motor or sensory changes. He has not pursued any treatment for this beyond nonsteroidal anti-inflammatories and heat therapy. Regarding his low back pain, this has been going on for about 15 years. He attributes  his low back symptoms to a sports injury when he was younger. He described that his low back symptoms are episodic and will affect the for periods of a couple of months, however this most recent flareup has been going on roughly for about a year. He describes pain along the paraspinal muscles and midline, nonradiating into the lower extremities. Pain is exacerbated by flexion extension and rotation of the lumbar spine. He has tried heat therapy, cold therapy, nonsteroidal anti-inflammatories, and narcotics such as Percocet and tramadol. 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 He denies any bowel or bladder control symptoms or saddle anesthesia.  Patient also has significant morning stiffness, typically lasting > 1 hour. This also involves multiple joints. He has a family history of rheumatoid arthritis.  05/08/2020  Ultimately, he was being managed with chronic opioid therapy by [facility] for the past year, as evidenced in controlled substance reporting system and chart review. He reports he has changed insurances and now is seen by [facility]. He was seen at initial consult by [facility] on 04/23/2020 and was referred to rheumatology. He reports his pain medication was deferred until that visit and now he has run out. He is currently receiving percoect 5-325 mg TID.   MRI 2.2021  C2-C3: Chronic posterior osteophyte, more prominent to the right and left,  producing ventral compression of thecal sac but no significant cord  compression. The foramina appear patent.  C3-C4: Chronic posterior disc bulge and mild endplate osteophytes, slightly  greater to the left of midline, producing very mild ventral impression on  the thecal sac without cord compression or foraminal stenosis.   C4-C5: No significant disc bulge or herniation. No canal or foraminal    stenosis.   C5-C6: No significant disc bulge or herniation. No canal or foraminal  stenosis.   C6- C7: Mild bony encroachment on the left foramen is suspected by  uncovertebral joint osteophytes. No central canal or right foraminal  involvement.        Medications: Outpatient Medications Prior to Visit  Medication Sig  . amphetamine-dextroamphetamine (ADDERALL XR) 15 MG 24 hr capsule Take 1 capsule by mouth every morning.  Marland Kitchen ibuprofen (ADVIL) 600 MG tablet Take 600 mg by mouth as needed.   . metaxalone (SKELAXIN) 800 MG tablet Take 800 mg by mouth 3 (three) times daily.  . meloxicam (MOBIC) 15 MG tablet Take 1 tablet (15 mg total) by mouth daily.  Marland Kitchen oxyCODONE-acetaminophen (PERCOCET/ROXICET) 5-325 MG tablet Take 1 tablet by mouth 3 (three) times daily. (Patient not taking: Reported on 05/08/2020)   No facility-administered medications prior to visit.    Review of Systems  Constitutional: Negative.   Respiratory: Negative.   Cardiovascular: Negative for chest pain.  Gastrointestinal: Negative for abdominal pain.  Musculoskeletal: Positive for back pain.  Neurological: Negative for tingling, weakness, numbness and paresthesias.      Objective    BP 118/72 (BP Location: Right Arm, Patient Position: Sitting, Cuff Size: Normal)   Pulse 95   Temp 98.8 F (37.1 C) (Oral)   Wt 166 lb 12.8 oz (75.7 kg)   SpO2 98%   BMI 27.76 kg/m    Physical Exam Constitutional:      Appearance: Normal appearance.  Cardiovascular:     Rate and Rhythm: Normal rate and regular rhythm.     Heart sounds: Normal heart sounds.  Pulmonary:     Effort: Pulmonary effort is normal.     Breath sounds: Normal breath sounds.  Musculoskeletal:     Cervical back: Tenderness present. No swelling, edema or deformity. Decreased range of motion.  Skin:    General: Skin is warm and dry.  Neurological:     General: No focal deficit present.     Mental Status: He is alert and oriented to person,  place, and time.  Psychiatric:        Mood and Affect: Mood normal.        Behavior: Behavior normal.       No results found for any visits on 05/08/20.  Assessment & Plan    1. Cervical spondylosis  I will give him 30 pills of tramadol to get him to his next appointment. There will be no refills on this. If his next specialist does not renew his pain medicine, he has been advised he may not get it renewed here. I have reviewed controlled substance reporting, previous imaging and treatment notes.   - traMADol (ULTRAM) 50 MG tablet; Take 1 tablet (50 mg total) by mouth every 8 (eight) hours as needed for up to 7 days.  Dispense: 30 tablet; Refill: 0  2. Cervical radiculopathy  - traMADol (ULTRAM) 50 MG tablet; Take 1 tablet (50 mg total) by mouth every 8 (eight) hours as needed for up to 7 days.  Dispense: 30 tablet; Refill: 0  3. Need for tetanus booster  - Tdap vaccine greater than or equal to 7yo IM  I spent  40 minutes dedicated to the care of this patient on the date of this encounter to include pre-visit review of records, face-to-face time with the patient discussing neck pain, and post visit ordering of testing.   No follow-ups on file.      ITrey Sailors, PA-C, have reviewed all documentation for this visit. The documentation on 05/11/20 for the exam, diagnosis, procedures, and orders are all accurate and complete.  The entirety of the information documented in the History of Present Illness, Review of Systems and Physical Exam were personally obtained by me. Portions of this information were initially documented by [facility] and reviewed by me for thoroughness and accuracy.     Maryella Shivers  [facility] [phone] (phone) [phone] (fax)  [facility]

## 2020-05-19 ENCOUNTER — Other Ambulatory Visit: Payer: Self-pay | Admitting: Physician Assistant

## 2020-05-19 DIAGNOSIS — M47812 Spondylosis without myelopathy or radiculopathy, cervical region: Secondary | ICD-10-CM

## 2020-05-19 DIAGNOSIS — M5412 Radiculopathy, cervical region: Secondary | ICD-10-CM

## 2020-05-19 NOTE — Telephone Encounter (Signed)
Requested medication (s) are due for refill today: no   Requested medication (s) are on the active medication list: no   Last refill:  start: 05/08/20 end: 05/15/20 #30 0 refills   Future visit scheduled: no  Notes to clinic:  medication not on med list, expired. Do you want to renew? Not delegated per protocol     Requested Prescriptions  Pending Prescriptions Disp Refills   traMADol (ULTRAM) 50 MG tablet [Pharmacy Med Name: TRAMADOL 50MG  TABLETS] 30 tablet     Sig: TAKE 1 TABLET(50 MG) BY MOUTH EVERY 8 HOURS FOR UP TO 7 DAYS AS NEEDED      Not Delegated - Analgesics:  Opioid Agonists Failed - 05/19/2020 12:10 PM      Failed - This refill cannot be delegated      Failed - Urine Drug Screen completed in last 360 days.      Passed - Valid encounter within last 6 months    Recent Outpatient Visits           1 week ago Cervical spondylosis   [facility] M, M   4 months ago [place] toe pain, right   [place], [employer], FNP   8 months ago H/O urinary frequency   [facility], [employer], FNP   9 months ago Attention deficit hyperactivity disorder (ADHD), unspecified ADHD type   [facility], [facility], FNP

## 2020-05-20 NOTE — Telephone Encounter (Signed)
Please review request. KW 

## 2020-05-20 NOTE — Telephone Encounter (Signed)
  1. Cervical spondylosis   I will give him 30 pills of tramadol to get him to his next appointment. There will be no refills on this. If his next specialist does not renew his pain medicine, he has been advised he may not get it renewed here. I have reviewed controlled substance reporting, previous imaging and treatment notes.    - traMADol (ULTRAM) 50 MG tablet; Take 1 tablet (50 mg total) by mouth every 8 (eight) hours as needed for up to 7 days.  Dispense: 30 tablet; Refill: 0   Note by Wendee Beavers as above.  Patient was advised of this at last office visit per note.    Patient should have seen his specialist by now.  No narcotic refills.

## 2021-03-18 ENCOUNTER — Ambulatory Visit
Admission: EM | Admit: 2021-03-18 | Discharge: 2021-03-18 | Disposition: A | Discharge status: Home / Self Care | Payer: Managed Care, Other (non HMO) | Attending: Family Medicine | Admitting: Family Medicine

## 2021-03-18 ENCOUNTER — Other Ambulatory Visit: Payer: Self-pay

## 2021-03-18 ENCOUNTER — Encounter: Payer: Self-pay | Admitting: Emergency Medicine

## 2021-03-18 DIAGNOSIS — M545 Low back pain, unspecified: Secondary | ICD-10-CM

## 2021-03-18 MED ORDER — PREDNISONE 10 MG PO TABS: ORAL_TABLET | ORAL | 0 refills | Status: DC

## 2021-03-18 NOTE — ED Triage Notes (Signed)
Pt is present today with mid back pain. Pt states that he noticed the pain last weekend. Pt describes the pain as a shooting pain. Denies any urinary sx

## 2021-03-18 NOTE — Discharge Instructions (Addendum)
Rest.  Heat.  Medication as prescribed.  Take care  Dr. Anjolaoluwa Siguenza  

## 2021-03-18 NOTE — ED Provider Notes (Signed)
[facility]    CSN: [id number] Arrival date & time: 03/18/21  1132      History   Chief Complaint Chief Complaint  Patient presents with   Back Pain   HPI  35 year old male with a history of chronic back pain presents with back pain.  Patient has history of chronic back pain.  He has previously seen pain management as well as orthopedic surgery.  MRI 06/2020 reviewed today.  Mild disc degeneration noted at L4/5 and L5/S1.  Patient reports back pain since this past weekend.  Pain is located in the low back bilaterally.  7/10 in severity.  No radicular symptoms.  No relieving factors.  No known inciting factor.  No other complaints or concerns at this time.  Past Medical History:  Diagnosis Date   ADHD    Enlarged prostate    Vasovagal syncope     Patient Active Problem List   Diagnosis Date Noted   Great toe pain, right 12/31/2019   Cervical spondylosis 09/16/2019   Cervical radiculopathy 09/16/2019   History of acute prostatitis 07/31/2019   Long-term current use of stimulant 07/31/2019   History of syncope 07/31/2019   Cervical spine pain 07/08/2019   Low back pain 07/08/2011   Attention deficit hyperactivity disorder (ADHD) 04/20/2011   Anxiety 04/20/2011   Muscle pain 04/20/2011    Past Surgical History:  Procedure Laterality Date   NO PAST SURGERIES         Home Medications    Prior to Admission medications   Medication Sig Start Date End Date Taking? Authorizing Provider  predniSONE (DELTASONE) 10 MG tablet 50 mg daily x 2 days, then 40 mg daily x 2 days, then 30 mg daily x 2 days, then 20 mg daily x 2 days, then 10 mg daily x 2 days. 03/18/21  Yes Tommie Sams, DO  amphetamine-dextroamphetamine (ADDERALL XR) 15 MG 24 hr capsule Take 1 capsule by mouth every morning. 04/21/20   Maple Hudson., MD    Family History Family History  Problem Relation Age of Onset   Hypertension Mother    Hypertension Father    Hyperlipidemia Father     ADD / ADHD Daughter    ADD / ADHD Son    Hypertension Maternal Uncle    Cancer Paternal Aunt        breast   Hyperlipidemia Maternal Grandmother    Hypertension Maternal Grandmother    Hypertension Maternal Grandfather    Arthritis Paternal Grandmother    Macular degeneration Paternal Grandmother     Social History Social History   Tobacco Use   Smoking status: Never   Smokeless tobacco: Never  Vaping Use   Vaping Use: Every day  Substance Use Topics   Alcohol use: Not Currently   Drug use: Not Currently     Allergies   Other   Review of Systems Review of Systems  Constitutional: Negative.   Musculoskeletal:  Positive for back pain.    Physical Exam Triage Vital Signs ED Triage Vitals  Enc Vitals Group     BP 03/18/21 1142 133/85     Pulse Rate 03/18/21 1142 96     Resp 03/18/21 1142 18     Temp 03/18/21 1142 98.3 F (36.8 C)     Temp Source 03/18/21 1142 Oral     SpO2 03/18/21 1142 96 %     Weight --      Height --      Head Circumference --  Peak Flow --      Pain Score 03/18/21 1140 7     Pain Loc --      Pain Edu? --      Excl. in GC? --    Updated Vital Signs BP 133/85   Pulse 96   Temp 98.3 F (36.8 C) (Oral)   Resp 18   SpO2 96%   Visual Acuity Right Eye Distance:   Left Eye Distance:   Bilateral Distance:    Right Eye Near:   Left Eye Near:    Bilateral Near:     Physical Exam Constitutional:      General: He is not in acute distress.    Appearance: Normal appearance. He is not ill-appearing.  HENT:     Head: Normocephalic and atraumatic.  Cardiovascular:     Rate and Rhythm: Normal rate and regular rhythm.  Pulmonary:     Effort: Pulmonary effort is normal.     Breath sounds: Normal breath sounds. No wheezing, rhonchi or rales.  Musculoskeletal:     Comments: Lumbar spine with left-sided para musculature tenderness to palpation.  Neurological:     Mental Status: He is alert.  Psychiatric:        Mood and Affect:  Mood normal.        Behavior: Behavior normal.    UC Treatments / Results  Labs (all labs ordered are listed, but only abnormal results are displayed) Labs Reviewed - No data to display  EKG   Radiology No results found.  Procedures Procedures (including critical care time)  Medications Ordered in UC Medications - No data to display  Initial Impression / Assessment and Plan / UC Course  I have reviewed the triage vital signs and the nursing notes.  Pertinent labs & imaging results that were available during my care of the patient were reviewed by me and considered in my medical decision making (see chart for details).    35 year old male presents with back pain.  This is a chronic problem with acute exacerbation.  Treating with prednisone.  Final Clinical Impressions(s) / UC Diagnoses   Final diagnoses:  Acute bilateral low back pain without sciatica     Discharge Instructions      Rest.  Heat.  Medication as prescribed.  Take care  Dr. Adriana Simas    ED Prescriptions     Medication Sig Dispense Auth. Provider   predniSONE (DELTASONE) 10 MG tablet 50 mg daily x 2 days, then 40 mg daily x 2 days, then 30 mg daily x 2 days, then 20 mg daily x 2 days, then 10 mg daily x 2 days. 30 tablet Tommie Sams, DO      PDMP not reviewed this encounter.   Tommie Sams, [place] 03/18/21 1214

## 2021-07-12 ENCOUNTER — Telehealth: Payer: Self-pay

## 2021-07-12 NOTE — Telephone Encounter (Signed)
Left message to call back for appt

## 2021-07-12 NOTE — Telephone Encounter (Signed)
Copied from CRM [id number]. Topic: General - Other >> Jul 12, 2021  1:21 PM Jaquita Rector A wrote: Reason for CRM: Patient called in needing to schedule an appointment with any of the new providers need to get a serum HSV antibody test and need refill on Aderall. Please call Ph#  [phone]

## 2021-07-20 ENCOUNTER — Other Ambulatory Visit: Payer: Self-pay

## 2021-07-20 ENCOUNTER — Encounter: Payer: Self-pay | Admitting: Family Medicine

## 2021-07-20 ENCOUNTER — Ambulatory Visit: Payer: BC Managed Care – PPO | Admitting: Family Medicine

## 2021-07-20 VITALS — BP 135/100 | HR 101 | Resp 15 | Wt 166.0 lb

## 2021-07-20 DIAGNOSIS — F909 Attention-deficit hyperactivity disorder, unspecified type: Secondary | ICD-10-CM

## 2021-07-20 DIAGNOSIS — R030 Elevated blood-pressure reading, without diagnosis of hypertension: Secondary | ICD-10-CM | POA: Diagnosis not present

## 2021-07-20 DIAGNOSIS — Z20828 Contact with and (suspected) exposure to other viral communicable diseases: Secondary | ICD-10-CM | POA: Diagnosis not present

## 2021-07-20 MED ORDER — AMPHETAMINE-DEXTROAMPHET ER 15 MG PO CP24: 15.0000 mg | ORAL_CAPSULE | ORAL | 0 refills | Status: DC

## 2021-07-20 NOTE — Progress Notes (Signed)
Established patient visit   Patient: Eric Mccormick   DOB: 06-24-1986   35 y.o. Male  MRN: [id number] Visit Date: 07/20/2021  Today's healthcare provider: Jacky Kindle, FNP   Chief Complaint  Patient presents with   ADHD   Exposure to STD    Patient states that he recently split from his wife and states that he had concerns of "spots" that would appear in his genital region. Patient states that his now estranged wife recently told him she is HSV positive.    Subjective    HPI HPI     Exposure to STD    Additional comments: Patient states that he recently split from his wife and states that he had concerns of "spots" that would appear in his genital region. Patient states that his now estranged wife recently told him she is HSV positive.       Last edited by Fonda Kinder, CMA on 07/20/2021  3:49 PM.      Follow up for ADHD  The patient was last seen for this 19 months ago. Changes made at last visit include none refilled Adderall 15mg .  He reports excellent compliance with treatment. Patient states that when he was on medication he had good compliance on XR 20mg  but would like to discuss starting back medication at a lower dose. He feels that condition is Improved on medication.  He is not having side effects.   -----------------------------------------------------------------------------------------     Medications: Outpatient Medications Prior to Visit  Medication Sig   [DISCONTINUED] amphetamine-dextroamphetamine (ADDERALL XR) 15 MG 24 hr capsule Take 1 capsule by mouth every morning.   [DISCONTINUED] predniSONE (DELTASONE) 10 MG tablet 50 mg daily x 2 days, then 40 mg daily x 2 days, then 30 mg daily x 2 days, then 20 mg daily x 2 days, then 10 mg daily x 2 days.   No facility-administered medications prior to visit.    Review of Systems     Objective    BP (!) 135/100   Pulse (!) 101   Resp 15   Wt 166 lb (75.3 kg)   SpO2 98%   BMI 27.62  kg/m    Physical Exam Vitals and nursing note reviewed.  Constitutional:      Appearance: Normal appearance. He is well-groomed.  HENT:     Head: Normocephalic and atraumatic.  Eyes:     Pupils: Pupils are equal, round, and reactive to light.  Cardiovascular:     Rate and Rhythm: Normal rate and regular rhythm.     Pulses: Normal pulses.     Heart sounds: Normal heart sounds.  Pulmonary:     Effort: Pulmonary effort is normal.     Breath sounds: Normal breath sounds.  Musculoskeletal:        General: Normal range of motion.     Cervical back: Normal range of motion.  Skin:    General: Skin is warm and dry.     Capillary Refill: Capillary refill takes less than 2 seconds.  Neurological:     General: No focal deficit present.     Mental Status: He is alert and oriented to person, place, and time. Mental status is at baseline.      No results found for any visits on 07/20/21.  Assessment & Plan     Problem List Items Addressed This Visit       Other   Attention deficit hyperactivity disorder (ADHD) - Primary  Restart medication Reviewed database Reviewed common side effects and complications with use      Relevant Medications   amphetamine-dextroamphetamine (ADDERALL XR) 15 MG 24 hr capsule   Exposure to herpes simplex virus (HSV)    Known exposure; same sexual partner >10 years Recently separated       Relevant Orders   HSV(herpes simplex vrs) 1+2 ab-IgG   Elevated blood pressure reading in office without diagnosis of hypertension    Elevated DBP Denies concerns CTM        Return in about 4 weeks (around 08/17/2021) for ADD medication restart.      Leilani Merl, FNP, have reviewed all documentation for this visit. The documentation on 07/20/21 for the exam, diagnosis, procedures, and orders are all accurate and complete.    Jacky Kindle, FNP  [facility] [phone] (phone) [phone] (fax)  [facility]

## 2021-07-20 NOTE — Assessment & Plan Note (Signed)
Elevated DBP Denies concerns CTM

## 2021-07-20 NOTE — Assessment & Plan Note (Signed)
Restart medication Reviewed database Reviewed common side effects and complications with use

## 2021-07-20 NOTE — Assessment & Plan Note (Signed)
Known exposure; same sexual partner >10 years Recently separated

## 2021-11-19 ENCOUNTER — Ambulatory Visit: Payer: Self-pay

## 2021-11-19 NOTE — Telephone Encounter (Signed)
?  Chief Complaint: Knee swelling ?Symptoms: Both knees swelling ?Frequency: 3 days ?Pertinent Negatives: Patient denies fever, redness. ?Disposition: [] ED /[] Urgent Care (no appt availability in office) / [x] Appointment(In office/virtual)/ []  [facility]/ [] Home Care/ [] Refused Recommended Disposition /[] [place] [facility]/ []  Follow-up with PCP ?Additional Notes: Knees have been swollen past 3 days. No pain, - discomfort. Pt has hx of arthritis. ? ? ?Summary: pain and swelling in knees  ? Pt has pain and swelling in both knees. Please call back   ?  ? ?Reason for Disposition ? [1] MODERATE pain (e.g., interferes with normal activities, limping) AND [2] present > 3 days ? ?Answer Assessment - Initial Assessment Questions ?1. LOCATION: "Where is the swelling located?"  (e.g., left, right, both knees) ?    both ?2. SIZE and DESCRIPTION: "What does the swelling look like?"  (e.g., entire knee, localized) ?    Entire knee ?3. ONSET: "When did the swelling start?" "Does it come and go, or is it there all the time?" ?    3 days ?4. PAIN: "Is there any pain?" If Yes, ask: "How bad is it?" (Scale 1-10; or mild, moderate, severe) ?    mild ?5. SETTING: "Has there been any recent work, exercise or other activity that involved that part of the body?"  ?    no ?6. AGGRAVATING FACTORS: "What makes the knee swelling worse?" (e.g., walking, climbing stairs, running) ?    stairs ?7. ASSOCIATED SYMPTOMS: "Is there any pain or redness?" ?    irritated ?8. OTHER SYMPTOMS: "Do you have any other symptoms?" (e.g., chest pain, difficulty breathing, fever, calf pain) ?    no ?9. PREGNANCY: "Is there any chance you are pregnant?" "When was your last menstrual period?" ?    na ? ?Protocols used: Knee Swelling-A-AH ? ?

## 2021-11-22 ENCOUNTER — Ambulatory Visit: Payer: Self-pay | Admitting: Physician Assistant

## 2021-11-22 NOTE — Progress Notes (Deleted)
? ?Acute Office Visit ? ?Subjective:  ? ? Patient ID: Eric Mccormick, male    DOB: 05-27-1986, 36 y.o.   MRN: [id number] ? ?No chief complaint on file. ? ? ?HPI ?Patient is in today for knee swelling and discomfort.  ? ?Past Medical History:  ?Diagnosis Date  ? ADHD   ? Enlarged prostate   ? Vasovagal syncope   ? ? ?Past Surgical History:  ?Procedure Laterality Date  ? NO PAST SURGERIES    ? ? ?Family History  ?Problem Relation Age of Onset  ? Hypertension Mother   ? Hypertension Father   ? Hyperlipidemia Father   ? ADD / ADHD Daughter   ? ADD / ADHD Son   ? Hypertension Maternal Uncle   ? Cancer Paternal Aunt   ?     breast  ? Hyperlipidemia Maternal Grandmother   ? Hypertension Maternal Grandmother   ? Hypertension Maternal Grandfather   ? Arthritis Paternal Grandmother   ? Macular degeneration Paternal Grandmother   ? ? ?Social History  ? ?Socioeconomic History  ? Marital status: Married  ?  Spouse name: Not on file  ? Number of children: Not on file  ? Years of education: Not on file  ? Highest education level: Not on file  ?Occupational History  ? Not on file  ?Tobacco Use  ? Smoking status: Never  ? Smokeless tobacco: Never  ?Vaping Use  ? Vaping Use: Every day  ?Substance and Sexual Activity  ? Alcohol use: Not Currently  ? Drug use: Not Currently  ? Sexual activity: Not on file  ?Other Topics Concern  ? Not on file  ?Social History Narrative  ? Not on file  ? ?Social Determinants of Health  ? ?Financial Resource Strain: Not on file  ?Food Insecurity: Not on file  ?Transportation Needs: Not on file  ?Physical Activity: Not on file  ?Stress: Not on file  ?Social Connections: Not on file  ?Intimate Partner Violence: Not on file  ? ? ?Outpatient Medications Prior to Visit  ?Medication Sig Dispense Refill  ? amphetamine-dextroamphetamine (ADDERALL XR) 15 MG 24 hr capsule Take 1 capsule by mouth every morning. 30 capsule 0  ? ?No facility-administered medications prior to visit.  ? ? ?Allergies  ?Allergen Reactions   ? Other Nausea And Vomiting  ?  [place]  ? ? ?Review of Systems ? ?   ?Objective:  ?  ?Physical Exam ? ?There were no vitals taken for this visit. ?Wt Readings from Last 3 Encounters:  ?07/20/21 166 lb (75.3 kg)  ?05/08/20 166 lb 12.8 oz (75.7 kg)  ?12/31/19 166 lb (75.3 kg)  ? ? ?Health Maintenance Due  ?Topic Date Due  ? HIV Screening  Never done  ? Hepatitis C Screening  Never done  ? COVID-19 Vaccine (2 - Moderna risk series) 12/07/2019  ? INFLUENZA VACCINE  03/29/2021  ? ? ?There are no preventive care reminders to display for this patient. ? ? ?Lab Results  ?Component Value Date  ? TSH 1.670 07/30/2019  ? ?Lab Results  ?Component Value Date  ? WBC 8.9 07/30/2019  ? HGB 14.5 07/30/2019  ? HCT 43.3 07/30/2019  ? MCV 93 07/30/2019  ? PLT 376 07/30/2019  ? ?Lab Results  ?Component Value Date  ? NA 139 07/30/2019  ? K 4.6 07/30/2019  ? CO2 24 07/30/2019  ? GLUCOSE 89 07/30/2019  ? BUN 18 07/30/2019  ? CREATININE 1.09 07/30/2019  ? BILITOT 0.4 07/30/2019  ? ALKPHOS 67 07/30/2019  ?  AST 24 07/30/2019  ? ALT 39 07/30/2019  ? PROT 8.0 07/30/2019  ? ALBUMIN 5.2 (H) 07/30/2019  ? CALCIUM 10.0 07/30/2019  ? ?Lab Results  ?Component Value Date  ? CHOL 212 (H) 07/30/2019  ? ?Lab Results  ?Component Value Date  ? HDL 47 07/30/2019  ? ?Lab Results  ?Component Value Date  ? LDLCALC 149 (H) 07/30/2019  ? ?Lab Results  ?Component Value Date  ? TRIG 87 07/30/2019  ? ?Lab Results  ?Component Value Date  ? CHOLHDL 4.5 07/30/2019  ? ?No results found for: HGBA1C ? ?   ?Assessment & Plan:  ? ?Problem List Items Addressed This Visit   ?None ? ? ? ?No orders of the defined types were placed in this encounter. ? ? ? ?Beverlee Nims, [place] ? ?

## 2022-01-05 ENCOUNTER — Encounter: Payer: Self-pay | Admitting: Emergency Medicine

## 2022-01-05 ENCOUNTER — Ambulatory Visit
Admission: EM | Admit: 2022-01-05 | Discharge: 2022-01-05 | Disposition: A | Discharge status: Home / Self Care | Payer: BC Managed Care – PPO | Attending: Family | Admitting: Family

## 2022-01-05 DIAGNOSIS — H5789 Other specified disorders of eye and adnexa: Secondary | ICD-10-CM

## 2022-01-05 DIAGNOSIS — H1032 Unspecified acute conjunctivitis, left eye: Secondary | ICD-10-CM | POA: Diagnosis not present

## 2022-01-05 DIAGNOSIS — L02412 Cutaneous abscess of left axilla: Secondary | ICD-10-CM | POA: Diagnosis not present

## 2022-01-05 MED ORDER — DOXYCYCLINE HYCLATE 100 MG PO CAPS: 100.0000 mg | ORAL_CAPSULE | Freq: Two times a day (BID) | ORAL | 0 refills | Status: AC

## 2022-01-05 MED ORDER — GENTAMICIN SULFATE 0.3 % OP SOLN: 2.0000 [drp] | Freq: Four times a day (QID) | OPHTHALMIC | 0 refills | Status: DC

## 2022-01-05 NOTE — Discharge Instructions (Addendum)
Recommend start Doxycycline 100mg  twice a day for 7 to 10 days for abscess/skin infection. Continue to apply warm compresses to area 3 to 4 times a day to help continue drainage. May take OTC Ibuprofen 600mg  every 6 to 8 hours as needed for pain. May apply Gentamicin eye drops in left eye 4 times a day for 5 to 7 days. Wash hands frequently. Follow-up in 3 to 4 days if not improving.  ?

## 2022-01-05 NOTE — ED Triage Notes (Addendum)
Pt reports an abscess under left axilla. States it started as what looked like a "pimple" or ingrown hair and has now gotten painful. Also reports left eye redness. States he wears contacts and may not have washed his hands well enough.  ?

## 2022-01-05 NOTE — ED Provider Notes (Signed)
?[facility] ? ? ? ?CSN: [id number] ?Arrival date & time: 01/05/22  0803 ? ? ?  ? ?History   ?Chief Complaint ?Chief Complaint  ?Patient presents with  ? Abscess  ? Eye Problem  ? ? ?HPI ?Eric Mccormick is a 36 y.o. male.  ? ?36 year old male presents with 2 concerns. First, he has a abscess present under his left axilla. Started off as a small "pimple/ingrown hair" about 1 month ago. Released some pus at first but slowly has been getting worse until about 1 week ago when it became much larger and painful. Started draining yellowish discharge last night. Still very red and deep pain. No other bumps or irritation. Had to shave the hair under his arm yesterday due to drainage sticking to his hairs. Has not applied any topical medication to area. 2nd concern is left eye redness that started yesterday. Thought his contact had irritated his eye so he removed it last night. This morning woke up with more redness and yellowish discharge. Minimal itching- still irritated. Right eye still has contact in place and no symptoms yet. Denies any fever, nasal congestion or cough. Has not placed any eye drops in either eye yet. No known exposure to pink eye. Other chronic health issues include ADD. Has not taken Adderall in many weeks.  ? ?The history is provided by the patient.  ? ?Past Medical History:  ?Diagnosis Date  ? ADHD   ? Enlarged prostate   ? Vasovagal syncope   ? ? ?Patient Active Problem List  ? Diagnosis Date Noted  ? 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 Elevated blood pressure reading in office without diagnosis of hypertension 07/20/2021  ? Attention deficit hyperactivity disorder (ADHD) 04/20/2011  ? ? ?Past Surgical History:  ?Procedure Laterality Date  ? NO PAST SURGERIES    ? ? ? ? ? ?Home Medications   ? ?Prior to Admission medications   ?Medication Sig Start Date End Date Taking? Authorizing Provider  ?doxycycline (VIBRAMYCIN) 100 MG capsule Take 1 capsule (100 mg total) by mouth 2  (two) times daily for 10 days. 01/05/22 01/15/22 Yes Yazmyne Sara, Ali Lowe, NP  ?gentamicin (GARAMYCIN) 0.3 % ophthalmic solution Place 2 drops into the left eye 4 (four) times daily. 01/05/22  Yes Khalis Hittle, Ali Lowe, NP  ?amphetamine-dextroamphetamine (ADDERALL XR) 15 MG 24 hr capsule Take 1 capsule by mouth every morning. 07/20/21   Jacky Kindle, FNP  ? ? ?Family History ?Family History  ?Problem Relation Age of Onset  ? Hypertension Mother   ? Hypertension Father   ? Hyperlipidemia Father   ? ADD / ADHD Daughter   ? ADD / ADHD Son   ? Hypertension Maternal Uncle   ? Cancer Paternal Aunt   ?     breast  ? Hyperlipidemia Maternal Grandmother   ? Hypertension Maternal Grandmother   ? Hypertension Maternal Grandfather   ? Arthritis Paternal Grandmother   ? Macular degeneration Paternal Grandmother   ? ? ?Social History ?Social History  ? ?Tobacco Use  ? Smoking status: Never  ? Smokeless tobacco: Never  ?Vaping Use  ? Vaping Use: Every day  ?Substance Use Topics  ? Alcohol use: Not Currently  ? Drug use: Not Currently  ? ? ? ?Allergies   ?Other ? ? ?Review of Systems ?Review of Systems  ?Constitutional:  Negative for activity change, appetite change, chills, diaphoresis, fatigue and fever.  ?HENT:  Negative for congestion, ear discharge, ear pain, mouth  sores, postnasal drip, rhinorrhea, sinus pressure, sinus pain, sore throat and trouble swallowing.   ?Eyes:  Positive for pain (left eye irritation), discharge and redness. Negative for photophobia and visual disturbance.  ?Respiratory:  Negative for cough, chest tightness and shortness of breath.   ?Gastrointestinal:  Negative for nausea and vomiting.  ?Musculoskeletal:  Negative for arthralgias, myalgias, neck pain and neck stiffness.  ?Skin:  Positive for color change and wound.  ?Allergic/Immunologic: Positive for food allergies. Negative for environmental allergies and immunocompromised state.  ?Neurological:  Negative for dizziness, tremors, seizures, syncope,  light-headedness, numbness and headaches.  ?Hematological:  Negative for adenopathy. Does not bruise/bleed easily.  ? ? ?Physical Exam ?Triage Vital Signs ?ED Triage Vitals  ?Enc Vitals Group  ?   BP 01/05/22 0815 (!) 126/92  ?   Pulse Rate 01/05/22 0815 100  ?   Resp 01/05/22 0815 17  ?   Temp 01/05/22 0815 98.4 ?F (36.9 ?C)  ?   Temp Source 01/05/22 0815 Oral  ?   SpO2 01/05/22 0815 100 %  ?   Weight 01/05/22 0814 166 lb 0.1 oz (75.3 kg)  ?   Height 01/05/22 0814 5\' 5"  (1.651 m)  ?   Head Circumference --   ?   Peak Flow --   ?   Pain Score 01/05/22 0813 3  ?   Pain Loc --   ?   Pain Edu? --   ?   Excl. in GC? --   ? ?No data found. ? ?Updated Vital Signs ?BP (!) 126/92 (BP Location: Left Arm)   Pulse 100   Temp 98.4 ?F (36.9 ?C) (Oral)   Resp 17   Ht 5\' 5"  (1.651 m)   Wt 166 lb 0.1 oz (75.3 kg)   SpO2 100%   BMI 27.62 kg/m?  ? ?Visual Acuity ?Right Eye Distance:   ?Left Eye Distance:   ?Bilateral Distance:   ? ?Right Eye Near:   ?Left Eye Near:    ?Bilateral Near:    ? ?Physical Exam ?Vitals and nursing note reviewed.  ?Constitutional:   ?   General: He is awake. He is not in acute distress. ?   Appearance: He is well-developed and well-groomed.  ?HENT:  ?   Head: Normocephalic and atraumatic.  ?   Right Ear: Hearing, tympanic membrane, ear canal and external ear normal.  ?   Left Ear: Hearing, tympanic membrane, ear canal and external ear normal.  ?   Nose: Nose normal.  ?   Right Sinus: No maxillary sinus tenderness or frontal sinus tenderness.  ?   Left Sinus: No maxillary sinus tenderness or frontal sinus tenderness.  ?   Mouth/Throat:  ?   Lips: Pink.  ?   Mouth: Mucous membranes are moist.  ?   Pharynx: Oropharynx is clear. Uvula midline. No pharyngeal swelling, oropharyngeal exudate, posterior oropharyngeal erythema or uvula swelling.  ?Eyes:  ?   General: Lids are normal. Vision grossly intact. No visual field deficit.    ?   Right eye: No foreign body, discharge or hordeolum.     ?   Left eye:  Discharge present.No foreign body or hordeolum.  ?   Extraocular Movements: Extraocular movements intact.  ?   Conjunctiva/sclera:  ?   Right eye: Right conjunctiva is not injected. No chemosis. ?   Left eye: Left conjunctiva is injected. No chemosis. ?   Pupils: Pupils are equal, round, and reactive to light.  ?   Comments: Slight  yellowish discharge at medial corner of left eye near lacrimal duct. No swelling of eyelids.   ?Cardiovascular:  ?   Rate and Rhythm: Normal rate.  ?Pulmonary:  ?   Effort: Pulmonary effort is normal.  ?Musculoskeletal:     ?   General: Normal range of motion.  ?   Cervical back: Normal range of motion and neck supple.  ?Lymphadenopathy:  ?   Cervical: No cervical adenopathy.  ?Skin: ?   General: Skin is warm and dry.  ?   Capillary Refill: Capillary refill takes less than 2 seconds.  ?   Findings: Abscess (left axilla) and erythema present. No abrasion, bruising, burn, ecchymosis or rash.  ? ?    ?   Comments: 2cm by 1 1/2 cm oval raised abscess present on left mid axilla area. Red, inflamed and very tender. Small 55mm opening near base of abscess draining yellowish white fluid. No surrounding erythema. No other lesions/abscess present.   ?Neurological:  ?   General: No focal deficit present.  ?   Mental Status: He is alert and oriented to person, place, and time.  ?Psychiatric:     ?   Mood and Affect: Mood normal.     ?   Behavior: Behavior normal. Behavior is cooperative.     ?   Thought Content: Thought content normal.     ?   Judgment: Judgment normal.  ? ? ? ?UC Treatments / Results  ?Labs ?(all labs ordered are listed, but only abnormal results are displayed) ?Labs Reviewed - No data to display ? ?EKG ? ? ?Radiology ?No results found. ? ?Procedures ?Incision and Drainage ? ?Date/Time: 01/05/2022 8:45 AM ?Performed by: Sudie Grumbling, NP ?Authorized by: Sudie Grumbling, NP  ? ?Consent:  ?  Consent obtained:  Verbal ?  Consent given by:  Patient ?  Risks, benefits, and alternatives  were discussed: yes   ?  Risks discussed:  Bleeding, incomplete drainage and pain ?  Alternatives discussed:  Alternative treatment and observation ?Universal protocol:  ?  Procedure explained and questions answered to

## 2022-01-06 ENCOUNTER — Encounter: Payer: Self-pay | Admitting: Family Medicine

## 2022-01-06 ENCOUNTER — Telehealth: Payer: Self-pay

## 2022-01-06 NOTE — Telephone Encounter (Signed)
Copied from CRM [id number]. Topic: General - Inquiry ?>> Jan 06, 2022  9:14 AM Elliot Gault wrote: ?Reason for CRM: Patient would like to confirm date of service 07/20/2021 was filed under [facility] and not [employer]. Patient would like a follow up message through My Chart message ?

## 2022-01-06 NOTE — Telephone Encounter (Signed)
done

## 2022-10-06 ENCOUNTER — Encounter: Payer: Self-pay | Admitting: Family Medicine

## 2022-10-06 ENCOUNTER — Ambulatory Visit (INDEPENDENT_AMBULATORY_CARE_PROVIDER_SITE_OTHER): Payer: BC Managed Care – PPO | Admitting: Family Medicine

## 2022-10-06 VITALS — BP 130/88 | Temp 98.4°F | Ht 65.0 in | Wt 175.0 lb

## 2022-10-06 DIAGNOSIS — Z131 Encounter for screening for diabetes mellitus: Secondary | ICD-10-CM | POA: Insufficient documentation

## 2022-10-06 DIAGNOSIS — F1111 Opioid abuse, in remission: Secondary | ICD-10-CM | POA: Insufficient documentation

## 2022-10-06 DIAGNOSIS — Z1331 Encounter for screening for depression: Secondary | ICD-10-CM | POA: Diagnosis not present

## 2022-10-06 DIAGNOSIS — Z0000 Encounter for general adult medical examination without abnormal findings: Secondary | ICD-10-CM | POA: Diagnosis not present

## 2022-10-06 DIAGNOSIS — J324 Chronic pansinusitis: Secondary | ICD-10-CM

## 2022-10-06 DIAGNOSIS — E7800 Pure hypercholesterolemia, unspecified: Secondary | ICD-10-CM | POA: Diagnosis not present

## 2022-10-06 DIAGNOSIS — Z136 Encounter for screening for cardiovascular disorders: Secondary | ICD-10-CM

## 2022-10-06 DIAGNOSIS — R0683 Snoring: Secondary | ICD-10-CM | POA: Diagnosis not present

## 2022-10-06 DIAGNOSIS — Z23 Encounter for immunization: Secondary | ICD-10-CM

## 2022-10-06 DIAGNOSIS — F909 Attention-deficit hyperactivity disorder, unspecified type: Secondary | ICD-10-CM

## 2022-10-06 DIAGNOSIS — Z1329 Encounter for screening for other suspected endocrine disorder: Secondary | ICD-10-CM

## 2022-10-06 DIAGNOSIS — Z114 Encounter for screening for human immunodeficiency virus [HIV]: Secondary | ICD-10-CM

## 2022-10-06 DIAGNOSIS — Z1159 Encounter for screening for other viral diseases: Secondary | ICD-10-CM

## 2022-10-06 DIAGNOSIS — Z13220 Encounter for screening for lipoid disorders: Secondary | ICD-10-CM

## 2022-10-06 MED ORDER — AMPHETAMINE-DEXTROAMPHETAMINE 10 MG PO TABS: 10.0000 mg | ORAL_TABLET | Freq: Two times a day (BID) | ORAL | 0 refills | Status: AC

## 2022-10-06 MED ORDER — BUPROPION HCL ER (XL) 150 MG PO TB24: 150.0000 mg | ORAL_TABLET | Freq: Every day | ORAL | 1 refills | Status: AC

## 2022-10-06 NOTE — Assessment & Plan Note (Signed)
Recommend screening for DM given borderline obesity with hx of borderline HTN and elevated LDL Continue to recommend balanced, lower carb meals. Smaller meal size, adding snacks. Choosing water as drink of choice and increasing purposeful exercise.

## 2022-10-06 NOTE — Assessment & Plan Note (Signed)
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

## 2022-10-06 NOTE — Assessment & Plan Note (Signed)
Chronic, intermittent treatment Wishes to start IR treatment vs XR Trial of 10 mg BID with 6 month f/u PDMP reviewed

## 2022-10-06 NOTE — Assessment & Plan Note (Signed)
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

## 2022-10-06 NOTE — Progress Notes (Signed)
Introduced to Designer, [employer]ellery role and practice setting.  All questions answered.  Discussed provider/patient relationship and expectations.  Complete physical exam  Patient: Eric Mccormick   DOB: [birth year]   37 y.o. Male  MRN: [id number]  Subjective:    Endorses ongoing stressors, low libido, and wish to restart ADD treatment. Taking viagra from "[facility]"   Eric Mccormick is a 37 y.o. male who presents today for a complete physical exam. He reports consuming a general diet. The patient does not participate in regular exercise at present. He generally feels fairly well. He reports sleeping fairly well. He does have additional problems to discuss today.    Most recent fall risk assessment:    10/06/2022   10:33 AM  Fall Risk   Falls in the past year? 0  Number falls in past yr: 0  Injury with Fall? 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[place]) 10/06/2022   Positive screening for depression on 9-item Patient Health Questionnaire (PHQ-9) 10/06/2022   Encounter for lipid screening for cardiovascular disease 10/06/2022   Screening for diabetes mellitus 10/06/2022   Chronic pansinusitis 10/06/2022   Screening for thyroid disorder 10/06/2022   Encounter for hepatitis C screening test for low risk patient 10/06/2022   Encounter for screening for HIV 10/06/2022   Exposure to herpes simplex virus (HSV) 07/20/2021   Attention deficit hyperactivity disorder (ADHD) 04/20/2011   Past Medical History:  Diagnosis Date   ADHD    Enlarged prostate    Vasovagal syncope    Past Surgical History:  Procedure Laterality Date   NO PAST SURGERIES     Social History   Tobacco Use   Smoking  status: Never   Smokeless tobacco: Never  Vaping Use   Vaping Use: Every day  Substance Use Topics   Alcohol use: Not Currently   Drug use: Not Currently   Social History   Socioeconomic History   Marital status: Married    Spouse name: Not on file   Number of children: Not on file   Years of education: Not on file   Highest education level: Not on file  Occupational History   Not on file  Tobacco Use   Smoking status: Never   Smokeless tobacco: Never  Vaping Use   Vaping Use: Every day  Substance and Sexual Activity   Alcohol use: Not Currently   Drug use: Not Currently   Sexual activity: Not on file  Other Topics Concern   Not on file  Social History Narrative   Not on file   Social Determinants of Health   Financial Resource Strain: Not on file  Food Insecurity: Not on file  Transportation Needs: Not on file  Physical Activity: Not on file  Stress: Not on file  Social Connections: Not on file  Intimate Partner Violence: Not on file   Allergies  Allergen Reactions   Other Nausea And Vomiting    [place]      Patient Care Team: Gwyneth Sprout, FNP as PCP - General (Family Medicine)   Outpatient Medications Prior to Visit  Medication Sig   sildenafil (REVATIO) 20 MG tablet Take 20 mg by mouth daily as needed (ED).   [DISCONTINUED] gentamicin (GARAMYCIN) 0.3 % ophthalmic solution Place 2 drops into the left eye 4 (four) times daily.   [DISCONTINUED] amphetamine-dextroamphetamine (ADDERALL XR) 15 MG 24 hr capsule Take 1 capsule by mouth every morning. (Patient not taking: Reported on 10/06/2022)   No facility-administered medications prior to visit.    Review of Systems  HENT:  Positive for congestion.   Musculoskeletal:  Positive for back pain, myalgias and neck pain.  Psychiatric/Behavioral:  The patient is nervous/anxious.   All other systems reviewed and are negative.     Objective:     BP 130/88 (BP Location: Left Arm, Patient Position: Standing,  Cuff Size: Normal)   Temp 98.4 F (36.9 C)   Ht 5\' 5"  (1.651 m)   Wt 175 lb (79.4 kg)   SpO2 100%   BMI 29.12 kg/m   BP Readings from Last 3 Encounters:  10/06/22 130/88  01/05/22 (!) 126/92  07/20/21 (!) 135/100   Wt Readings from Last 3 Encounters:  10/06/22 175 lb (79.4 kg)  01/05/22 166 lb 0.1 oz (75.3 kg)  07/20/21 166 lb (75.3 kg)   SpO2 Readings from Last 3 Encounters:  10/06/22 100%  01/05/22 100%  07/20/21 98%   Physical Exam Vitals and nursing note reviewed.  Constitutional:      General: He is awake. He is not in acute distress.    Appearance: Normal appearance. He is well-developed, well-groomed and overweight. He is not ill-appearing, toxic-appearing or diaphoretic.  HENT:     Head: Normocephalic and atraumatic.     Jaw: There is normal jaw occlusion. No trismus, tenderness, swelling or pain on movement.     Salivary Glands: Right salivary gland is not diffusely enlarged or tender. Left salivary gland is not diffusely enlarged or tender.     Right Ear: Hearing, tympanic membrane, ear canal and external ear normal. There is no impacted cerumen.     Left Ear: Hearing, tympanic membrane, ear canal and external ear normal. There is no impacted cerumen.     Nose: Nose normal. No congestion or rhinorrhea.     Right Turbinates: Not enlarged, swollen or pale.     Left Turbinates: Not enlarged, swollen or pale.     Right Sinus: No maxillary sinus tenderness or frontal sinus tenderness.     Left Sinus: No maxillary sinus tenderness or frontal sinus tenderness.     Mouth/Throat:     Lips: Pink.     Mouth: Mucous membranes are moist. No injury, lacerations, oral lesions or angioedema.     Pharynx: Oropharynx is clear. Uvula midline. No pharyngeal swelling, oropharyngeal exudate or posterior oropharyngeal erythema.     Tonsils: No tonsillar exudate or tonsillar abscesses.  Eyes:     General: Lids are normal. Vision grossly intact. Gaze aligned appropriately.         Right eye: No discharge.        Left eye: No discharge.     Extraocular Movements: Extraocular movements intact.     Conjunctiva/sclera: Conjunctivae normal.     Pupils: Pupils are equal, round, and reactive to light.  Neck:     Thyroid: No thyroid mass, thyromegaly or thyroid tenderness.     Vascular: No carotid bruit.     Trachea: Trachea normal. No tracheal tenderness.  Cardiovascular:     Rate and Rhythm: Normal rate and regular rhythm.     Pulses:  Normal pulses.          Carotid pulses are 2+ on the right side and 2+ on the left side.      Radial pulses are 2+ on the right side and 2+ on the left side.       Femoral pulses are 2+ on the right side and 2+ on the left side.      Popliteal pulses are 2+ on the right side and 2+ on the left side.       Dorsalis pedis pulses are 2+ on the right side and 2+ on the left side.       Posterior tibial pulses are 2+ on the right side and 2+ on the left side.     Heart sounds: Normal heart sounds, S1 normal and S2 normal. No murmur heard.    No friction rub. No gallop.  Pulmonary:     Effort: Pulmonary effort is normal. No respiratory distress.     Breath sounds: Normal breath sounds and air entry. No stridor. No wheezing, rhonchi or rales.  Chest:     Chest wall: No tenderness.  Abdominal:     General: Abdomen is flat. Bowel sounds are normal. There is no distension.     Palpations: Abdomen is soft. There is no mass.     Tenderness: There is no abdominal tenderness. There is no guarding or rebound.     Hernia: No hernia is present.  Genitourinary:    Comments: Exam deferred; endorses concerns for ED and poor libido  Musculoskeletal:        General: Tenderness present. No swelling, deformity or signs of injury. Normal range of motion.     Cervical back: Normal range of motion and neck supple. Tenderness present. No rigidity.     Right lower leg: No edema.     Left lower leg: No edema.  Lymphadenopathy:     Cervical: No cervical  adenopathy.     Right cervical: No superficial, deep or posterior cervical adenopathy.    Left cervical: No superficial, deep or posterior cervical adenopathy.  Skin:    General: Skin is warm and dry.     Capillary Refill: Capillary refill takes less than 2 seconds.     Coloration: Skin is not jaundiced or pale.     Findings: No bruising, erythema, lesion or rash.  Neurological:     General: No focal deficit present.     Mental Status: He is alert and oriented to person, place, and time. Mental status is at baseline.     GCS: GCS eye subscore is 4. GCS verbal subscore is 5. GCS motor subscore is 6.     Sensory: Sensation is intact. No sensory deficit.     Motor: Motor function is intact. No weakness.     Coordination: Coordination is intact.     Gait: Gait is intact.  Psychiatric:        Attention and Perception: Attention and perception normal.        Mood and Affect: Mood and affect normal.        Speech: Speech normal.        Behavior: Behavior normal. Behavior is cooperative.        Thought Content: Thought content normal.        Cognition and Memory: Cognition normal.        Judgment: Judgment normal.      No results found for any visits on 10/06/22. Last CBC Lab Results  Component Value  Date   WBC 8.9 07/30/2019   HGB 14.5 07/30/2019   HCT 43.3 07/30/2019   MCV 93 07/30/2019   MCH 31.0 07/30/2019   RDW 12.5 07/30/2019   PLT 376 37/90/2409   Last metabolic panel Lab Results  Component Value Date   GLUCOSE 89 07/30/2019   NA 139 07/30/2019   K 4.6 07/30/2019   CL 100 07/30/2019   CO2 24 07/30/2019   BUN 18 07/30/2019   CREATININE 1.09 07/30/2019   GFRNONAA 89 07/30/2019   CALCIUM 10.0 07/30/2019   PROT 8.0 07/30/2019   ALBUMIN 5.2 (H) 07/30/2019   LABGLOB 2.8 07/30/2019   AGRATIO 1.9 07/30/2019   BILITOT 0.4 07/30/2019   ALKPHOS 67 07/30/2019   AST 24 07/30/2019   ALT 39 07/30/2019   Last lipids Lab Results  Component Value Date   CHOL 212 (H)  07/30/2019   HDL 47 07/30/2019   LDLCALC 149 (H) 07/30/2019   TRIG 87 07/30/2019   CHOLHDL 4.5 07/30/2019   Last thyroid functions Lab Results  Component Value Date   TSH 1.670 07/30/2019      Assessment & Plan:    Routine Health Maintenance and Physical Exam  Immunization History  Administered Date(s) Administered   Influenza,inj,Quad PF,6+ Mos 07/30/2019, 10/06/2022   Influenza-Unspecified 06/09/2011   Moderna Sars-Covid-2 Vaccination 11/09/2019   Tdap 05/08/2020    Health Maintenance  Topic Date Due   Hepatitis C Screening  Never done   COVID-19 Vaccine (2 - Moderna risk series) 12/07/2019   DTaP/Tdap/Td (2 - Td or Tdap) 05/08/2030   INFLUENZA VACCINE  Completed   HIV Screening  Completed   HPV VACCINES  Aged Out    Discussed health benefits of physical activity, and encouraged him to engage in regular exercise appropriate for his age and condition.  Problem List Items Addressed This Visit       Respiratory   Chronic pansinusitis    Chronic, worsening -believes he is allergic to his dogs -symptoms also worse with live pine tree over holidays -using saline mist and allergra; encouraged to switch to zyrtec/claritin with addition of flonase -referral placed to allergy for comprehensive testing       Relevant Orders   Ambulatory referral to Allergy     Other   Annual physical exam - Primary    UTD on vision Due for dental Endorses worsening stress- clenching teeth and causing neck and back tenderness Things to do to keep yourself healthy  - Exercise at least 30-45 minutes a day, 3-4 days a week.  - Eat a low-fat diet with lots of fruits and vegetables, up to 7-9 servings per day.  - Seatbelts can save your life. Wear them always.  - Smoke detectors on every level of your home, check batteries every year.  - Eye Doctor - have an eye exam every 1-2 years  - Safe sex - if you may be exposed to STDs, use a condom.  - Alcohol -  If you drink, do it moderately,  less than 2 drinks per day.  - [place]. Choose someone to speak for you if you are not able.  - Depression is common in our stressful world.If you're feeling down or losing interest in things you normally enjoy, please come in for a visit.  - Violence - If anyone is threatening or hurting you, please call immediately.       Relevant Orders   CBC with Differential/Platelet   Comprehensive Metabolic Panel (CMET)  Lipid panel   Hemoglobin A1c   TSH + free T4   Attention deficit hyperactivity disorder (ADHD)    Chronic, intermittent treatment Wishes to start IR treatment vs XR Trial of 10 mg BID with 6 month f/u PDMP reviewed       Elevated LDL cholesterol level    Chronic, elevated Not on statin Repeat LP Goal LDL <100 recommend diet low in saturated fat and regular exercise - 30 min at least 5 times per week       Encounter for hepatitis C screening test for low risk patient    Low risk screen Treatable, and curable. If left untreated Hep C can lead to cirrhosis and liver failure. Encourage routine testing; recommend repeat testing if risk factors change.       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 Smaller meal size, adding snacks. Choosing water as drink of choice and increasing purposeful exercise.       Relevant Orders   Hemoglobin A1c   Screening for thyroid disorder    Recommend given complaints of worsening anxiety symptoms, hx of elevated BP and weight change Body mass index is 29.12 kg/m.       Relevant Orders   TSH + free T4   Other Visit Diagnoses     Need for immunization against influenza       Relevant Orders   Flu Vaccine QUAD 69mo+IM (Fluarix, Fluzone & Alfiuria Quad PF) (Completed)      Return in about 6 months (around 04/06/2023) for chonic disease management.   Vonna Kotyk, FNP, have reviewed all documentation for this visit. The documentation on 10/06/22 for the exam, diagnosis, procedures, and orders are all accurate and complete.  Gwyneth Sprout, FNP

## 2022-10-06 NOTE — Assessment & Plan Note (Signed)
Low risk screen ?Consented; encouraged to "know your status" ?Recommend repeat screen if risk factors change ? ?

## 2022-10-06 NOTE — Assessment & Plan Note (Signed)
Chronic, elevated Not on statin Repeat LP Goal LDL <100 recommend diet low in saturated fat and regular exercise - 30 min at least 5 times per week

## 2022-10-06 NOTE — Assessment & Plan Note (Signed)
Recommend given complaints of worsening anxiety symptoms, hx of elevated BP and weight change Body mass index is 29.12 kg/m.

## 2022-10-06 NOTE — Assessment & Plan Note (Signed)
Low risk screen Treatable, and curable. If left untreated Hep C can lead to cirrhosis and liver failure. Encourage routine testing; recommend repeat testing if risk factors change.  

## 2022-10-06 NOTE — Assessment & Plan Note (Signed)
Repeat LP given hx of elevated LDL given risk for ASCVD

## 2022-10-06 NOTE — Assessment & Plan Note (Signed)
UTD on vision Due for dental Endorses worsening stress- clenching teeth and causing neck and back tenderness Things to do to keep yourself healthy  - Exercise at least 30-45 minutes a day, 3-4 days a week.  - Eat a low-fat diet with lots of fruits and vegetables, up to 7-9 servings per day.  - Seatbelts can save your life. Wear them always.  - Smoke detectors on every level of your home, check batteries every year.  - Eye Doctor - have an eye exam every 1-2 years  - Safe sex - if you may be exposed to STDs, use a condom.  - Alcohol -  If you drink, do it moderately, less than 2 drinks per day.  - [place]. Choose someone to speak for you if you are not able.  - Depression is common in our stressful world.If you're feeling down or losing interest in things you normally enjoy, please come in for a visit.  - Violence - If anyone is threatening or hurting you, please call immediately.

## 2022-10-06 NOTE — Assessment & Plan Note (Signed)
Agreeable to start wellbutrin to assist with mood disorders Contracted for safety; denies SI or HI

## 2022-10-07 LAB — HIV ANTIBODY (ROUTINE TESTING W REFLEX)

## 2022-10-07 NOTE — Progress Notes (Signed)
LDL elevation noted; remains improved from previous sampling. Continue to recommend balanced diet low in saturated fat with lower carb meals. Smaller meal size, adding snacks. Choosing water as drink of choice and increasing purposeful exercise, 30 min at least 5 times per week   All other labs are normal and stable.

## 2022-10-08 DIAGNOSIS — R0683 Snoring: Secondary | ICD-10-CM | POA: Insufficient documentation

## 2022-10-08 DIAGNOSIS — Z23 Encounter for immunization: Secondary | ICD-10-CM | POA: Insufficient documentation

## 2022-10-08 NOTE — Addendum Note (Signed)
Addended by: Tally Joe T on: 10/08/2022 01:54 PM   Modules accepted: Orders

## 2022-10-08 NOTE — Assessment & Plan Note (Signed)
Chronic, worsening Reports complaint from partner regarding snoring Also wakes up at night d/t snoring Borderline BP at 130/88 with bmi 29.12 Recommend sleep study to assist

## 2022-10-09 LAB — CBC WITH DIFFERENTIAL/PLATELET
Basophils Absolute: 0 10*3/uL (ref 0.0–0.2)
Basos: 1 %
EOS (ABSOLUTE): 0.5 10*3/uL — ABNORMAL HIGH (ref 0.0–0.4)
Eos: 6 %
Hematocrit: 44.1 % (ref 37.5–51.0)
Hemoglobin: 14.6 g/dL (ref 13.0–17.7)
Immature Grans (Abs): 0 10*3/uL (ref 0.0–0.1)
Immature Granulocytes: 0 %
Lymphocytes Absolute: 2.8 10*3/uL (ref 0.7–3.1)
Lymphs: 35 %
MCH: 30.2 pg (ref 26.6–33.0)
MCHC: 33.1 g/dL (ref 31.5–35.7)
MCV: 91 fL (ref 79–97)
Monocytes Absolute: 0.5 10*3/uL (ref 0.1–0.9)
Monocytes: 6 %
Neutrophils Absolute: 4.2 10*3/uL (ref 1.4–7.0)
Neutrophils: 52 %
Platelets: 406 10*3/uL (ref 150–450)
RBC: 4.83 x10E6/uL (ref 4.14–5.80)
RDW: 12.8 % (ref 11.6–15.4)
WBC: 8.1 10*3/uL (ref 3.4–10.8)

## 2022-10-09 LAB — COMPREHENSIVE METABOLIC PANEL
ALT: 18 IU/L (ref 0–44)
AST: 16 IU/L (ref 0–40)
Albumin/Globulin Ratio: 1.7 (ref 1.2–2.2)
Albumin: 4.7 g/dL (ref 4.1–5.1)
Alkaline Phosphatase: 61 IU/L (ref 44–121)
BUN/Creatinine Ratio: 11 (ref 9–20)
BUN: 12 mg/dL (ref 6–20)
Bilirubin Total: 0.3 mg/dL (ref 0.0–1.2)
CO2: 23 mmol/L (ref 20–29)
Calcium: 9.5 mg/dL (ref 8.7–10.2)
Chloride: 102 mmol/L (ref 96–106)
Creatinine, Ser: 1.05 mg/dL (ref 0.76–1.27)
Globulin, Total: 2.7 g/dL (ref 1.5–4.5)
Glucose: 94 mg/dL (ref 70–99)
Potassium: 4.4 mmol/L (ref 3.5–5.2)
Sodium: 140 mmol/L (ref 134–144)
Total Protein: 7.4 g/dL (ref 6.0–8.5)
eGFR: 94 mL/min/{1.73_m2} (ref >59)

## 2022-10-09 LAB — HIV 1/2 AB DIFFERENTIATION
HIV 1 Ab: NONREACTIVE
HIV 2 Ab: NONREACTIVE
NOTE (HIV CONF MULTIP: NEGATIVE

## 2022-10-09 LAB — LIPID PANEL
Chol/HDL Ratio: 4.1 ratio (ref 0.0–5.0)
Cholesterol, Total: 177 mg/dL (ref 100–199)
HDL: 43 mg/dL (ref >39)
LDL Chol Calc (NIH): 114 mg/dL — ABNORMAL HIGH (ref 0–99)
Triglycerides: 108 mg/dL (ref 0–149)
VLDL Cholesterol Cal: 20 mg/dL (ref 5–40)

## 2022-10-09 LAB — HIV-1/HIV-2 QUALITATIVE RNA
Final Interpretation: NEGATIVE
HIV-1 RNA, Qualitative: NONREACTIVE
HIV-2 RNA, Qualitative: NONREACTIVE

## 2022-10-09 LAB — HIV ANTIBODY (ROUTINE TESTING W REFLEX): HIV Screen 4th Generation wRfx: REACTIVE

## 2022-10-09 LAB — HEPATITIS C ANTIBODY: Hep C Virus Ab: NONREACTIVE

## 2022-10-09 LAB — TSH+FREE T4
Free T4: 1.29 ng/dL (ref 0.82–1.77)
TSH: 1.8 u[IU]/mL (ref 0.450–4.500)

## 2022-10-09 LAB — HEMOGLOBIN A1C
Est. average glucose Bld gHb Est-mCnc: 111 mg/dL
Hgb A1c MFr Bld: 5.5 % (ref 4.8–5.6)

## 2022-10-10 ENCOUNTER — Other Ambulatory Visit: Payer: Self-pay | Admitting: Family Medicine

## 2022-10-10 DIAGNOSIS — Z114 Encounter for screening for human immunodeficiency virus [HIV]: Secondary | ICD-10-CM

## 2023-02-10 NOTE — Progress Notes (Signed)
Pt did not show for scheduled appointment.  

## 2023-02-13 ENCOUNTER — Ambulatory Visit: Payer: Self-pay | Admitting: Internal Medicine

## 2023-04-04 ENCOUNTER — Ambulatory Visit (INDEPENDENT_AMBULATORY_CARE_PROVIDER_SITE_OTHER): Payer: BC Managed Care – PPO | Admitting: Family Medicine

## 2023-04-04 DIAGNOSIS — Z91199 Patient's noncompliance with other medical treatment and regimen due to unspecified reason: Secondary | ICD-10-CM

## 2023-04-04 NOTE — Progress Notes (Signed)
Patient was not seen for appt d/t no call, no show, or late arrival >10 mins past appt time.   Luara Faye T Pegi Milazzo, FNP  [facility] [address] [place], [place] [address] [phone] (phone) [phone] (fax) [facility]
